# Patient Record
Sex: Female | Born: 1995 | Race: Black or African American | Hispanic: No | Marital: Single | Smoking: Current every day smoker
Health system: Southern US, Community
[De-identification: ages and names within clinical notes are randomized; demographics above are authoritative.]

## PROBLEM LIST (undated history)

## (undated) HISTORY — PX: CHOLECYSTECTOMY: SHX55

---

## 2017-12-30 ENCOUNTER — Emergency Department
Admission: EM | Admit: 2017-12-30 | Discharge: 2017-12-30 | Disposition: A | Discharge status: Home / Self Care | Payer: Self-pay | Attending: Emergency Medicine | Admitting: Emergency Medicine

## 2017-12-30 ENCOUNTER — Emergency Department: Payer: Self-pay

## 2017-12-30 ENCOUNTER — Encounter: Payer: Self-pay | Admitting: Emergency Medicine

## 2017-12-30 DIAGNOSIS — K2900 Acute gastritis without bleeding: Secondary | ICD-10-CM | POA: Insufficient documentation

## 2017-12-30 DIAGNOSIS — F17200 Nicotine dependence, unspecified, uncomplicated: Secondary | ICD-10-CM | POA: Insufficient documentation

## 2017-12-30 LAB — CBC
HEMATOCRIT: 42 % (ref 35.0–47.0)
HEMOGLOBIN: 14 g/dL (ref 12.0–16.0)
MCH: 28.7 pg (ref 26.0–34.0)
MCHC: 33.3 g/dL (ref 32.0–36.0)
MCV: 86.2 fL (ref 80.0–100.0)
Platelets: 145 10*3/uL — ABNORMAL LOW (ref 150–440)
RBC: 4.88 MIL/uL (ref 3.80–5.20)
RDW: 14.2 % (ref 11.5–14.5)
WBC: 17 10*3/uL — AB (ref 3.6–11.0)

## 2017-12-30 LAB — BASIC METABOLIC PANEL
ANION GAP: 8 (ref 5–15)
BUN: 7 mg/dL (ref 6–20)
CO2: 23 mmol/L (ref 22–32)
Calcium: 9 mg/dL (ref 8.9–10.3)
Chloride: 106 mmol/L (ref 101–111)
Creatinine, Ser: 0.63 mg/dL (ref 0.44–1.00)
GFR calc non Af Amer: >60 mL/min (ref >60)
Glucose, Bld: 95 mg/dL (ref 65–99)
POTASSIUM: 3.8 mmol/L (ref 3.5–5.1)
SODIUM: 137 mmol/L (ref 135–145)

## 2017-12-30 LAB — HEPATIC FUNCTION PANEL
ALT: 22 U/L (ref 14–54)
AST: 20 U/L (ref 15–41)
Albumin: 3.9 g/dL (ref 3.5–5.0)
Alkaline Phosphatase: 69 U/L (ref 38–126)
TOTAL PROTEIN: 7.4 g/dL (ref 6.5–8.1)
Total Bilirubin: 0.5 mg/dL (ref 0.3–1.2)

## 2017-12-30 LAB — LIPASE, BLOOD: LIPASE: 32 U/L (ref 11–51)

## 2017-12-30 LAB — TROPONIN I: Troponin I: <0.03 ng/mL (ref <0.03)

## 2017-12-30 LAB — POCT PREGNANCY, URINE: PREG TEST UR: NEGATIVE

## 2017-12-30 MED ORDER — GI COCKTAIL ~~LOC~~
30.0000 mL | Freq: Once | ORAL | Status: AC
  Administered 2017-12-30: 30 mL via ORAL
  Filled 2017-12-30: qty 30

## 2017-12-30 MED ORDER — OMEPRAZOLE 40 MG PO CPDR: 40.0000 mg | DELAYED_RELEASE_CAPSULE | Freq: Every day | ORAL | 1 refills | Status: DC

## 2017-12-30 NOTE — ED Triage Notes (Signed)
Patient comes into the ED via POV c/o chest pain and emesis.  Patient denies any radiating pain, shortness of breath, or dizziness.  Patient states she has upper abdominal pain as well.  Patient ambulatory to triage at this time and in NAD with even and unlabored respirations.

## 2017-12-30 NOTE — ED Provider Notes (Signed)
[facility] Emergency Department Provider Note   ____________________________________________    I have reviewed the triage vital signs and the nursing notes.   HISTORY  Chief Complaint Abdominal pain    HPI Gabrielle Delacruz is a 22 y.o. female who presents with complaints of abdominal pain, patient describes burning in her epigastrium which started 1 day ago and has been intermittent.  She has a history of a cholecystectomy.  She does report that she has problems with acid reflux.  No chest pain described.  No pleurisy or shortness of breath.  No fevers or chills.  No diarrhea.  Has not taken anything for this.   History reviewed. No pertinent past medical history.  There are no active problems to display for this patient.   Past Surgical History:  Procedure Laterality Date  . CHOLECYSTECTOMY      Prior to Admission medications   Medication Sig Start Date End Date Taking? Authorizing Provider  omeprazole (PRILOSEC) 40 MG capsule Take 1 capsule (40 mg total) by mouth daily. 12/30/17 12/30/18  Jene EveryKinner, Bisma Klett, MD     Allergies Patient has no known allergies.  No family history on file.  Social History Social History   Tobacco Use  . Smoking status: Current Every Day Smoker  . Smokeless tobacco: Never Used  Substance Use Topics  . Alcohol use: No    Frequency: Never  . Drug use: No    Review of Systems  Constitutional: No fever/chills Eyes: No visual changes.  ENT: No sore throat. Cardiovascular: Denies chest pain. Respiratory: Denies shortness of breath. Gastrointestinal: No nausea, no vomiting.   Genitourinary: Negative for dysuria. Musculoskeletal: Negative for back pain. Skin: Negative for rash. Neurological: Negative for headaches    ____________________________________________   PHYSICAL EXAM:  VITAL SIGNS: ED Triage Vitals  Enc Vitals Group     BP 12/30/17 1950 132/87     Pulse Rate 12/30/17 1950 100     Resp 12/30/17  1950 17     Temp 12/30/17 1950 98.8 F (37.1 C)     Temp Source 12/30/17 1950 Oral     SpO2 12/30/17 1950 100 %     Weight 12/30/17 1951 97.5 kg (215 lb)     Height 12/30/17 1951 1.6 m (5\' 3" )     Head Circumference --      Peak Flow --      Pain Score 12/30/17 1951 8     Pain Loc --      Pain Edu? --      Excl. in GC? --     Constitutional: Alert and oriented. No acute distress. Pleasant and interactive Eyes: Conjunctivae are normal.   Nose: No congestion/rhinnorhea. Mouth/Throat: Mucous membranes are moist.    Cardiovascular: Normal rate, regular rhythm. Grossly normal heart sounds.  Good peripheral circulation. Respiratory: Normal respiratory effort.  No retractions. Lungs CTAB. Gastrointestinal: Soft and nontender. No distention.  No CVA tenderness. Genitourinary: deferred Musculoskeletal: No lower extremity tenderness nor edema.  Warm and well perfused Neurologic:  Normal speech and language. No gross focal neurologic deficits are appreciated.  Skin:  Skin is warm, dry and intact. No rash noted. Psychiatric: Mood and affect are normal. Speech and behavior are normal.  ____________________________________________   LABS (all labs ordered are listed, but only abnormal results are displayed)  Labs Reviewed  CBC - Abnormal; Notable for the following components:      Result Value   WBC 17.0 (*)    Platelets 145 (*)  All other components within normal limits  HEPATIC FUNCTION PANEL - Abnormal; Notable for the following components:   Bilirubin, Direct <0.1 (*)    All other components within normal limits  BASIC METABOLIC PANEL  TROPONIN I  LIPASE, BLOOD  POCT PREGNANCY, URINE  POC URINE PREG, ED   ____________________________________________  EKG  ED ECG REPORT I, Jene Every, the attending physician, personally viewed and interpreted this ECG.  Date: 12/30/2017  Rhythm: normal sinus rhythm QRS Axis: normal Intervals: normal ST/T Wave abnormalities:  normal Narrative Interpretation: no evidence of acute ischemia  ____________________________________________  RADIOLOGY  Chest x-ray normal ____________________________________________   PROCEDURES  Procedure(s) performed: No  Procedures   Critical Care performed: No ____________________________________________   INITIAL IMPRESSION / ASSESSMENT AND PLAN / ED COURSE  Pertinent labs & imaging results that were available during my care of the patient were reviewed by me and considered in my medical decision making (see chart for details).  Patient presents with epigastric abdominal pain, intermittent times 1 day, symptoms are relatively mild.  Exam is quite reassuring.  Lab work significant for elevated white blood cell count and mildly decreased platelets.  No evidence of bleeding.  Treated with GI cocktail with significant improvement in discomfort.  I will have the patient follow-up closely with PCP for further evaluation    ____________________________________________   FINAL CLINICAL IMPRESSION(S) / ED DIAGNOSES  Final diagnoses:  Acute gastritis without hemorrhage, unspecified gastritis type        Note:  This document was prepared using Dragon voice recognition software and may include unintentional dictation errors.    Jene Every, MD 12/30/17 [phone]

## 2018-07-14 ENCOUNTER — Encounter: Payer: Self-pay | Admitting: Emergency Medicine

## 2018-07-14 ENCOUNTER — Other Ambulatory Visit: Payer: Self-pay

## 2018-07-14 ENCOUNTER — Emergency Department
Admission: EM | Admit: 2018-07-14 | Discharge: 2018-07-14 | Disposition: A | Discharge status: Home / Self Care | Payer: Self-pay | Attending: Emergency Medicine | Admitting: Emergency Medicine

## 2018-07-14 DIAGNOSIS — N898 Other specified noninflammatory disorders of vagina: Secondary | ICD-10-CM | POA: Insufficient documentation

## 2018-07-14 DIAGNOSIS — F17210 Nicotine dependence, cigarettes, uncomplicated: Secondary | ICD-10-CM | POA: Insufficient documentation

## 2018-07-14 DIAGNOSIS — R102 Pelvic and perineal pain: Secondary | ICD-10-CM | POA: Insufficient documentation

## 2018-07-14 LAB — URINALYSIS, COMPLETE (UACMP) WITH MICROSCOPIC
BACTERIA UA: NONE SEEN
Bilirubin Urine: NEGATIVE
Glucose, UA: NEGATIVE mg/dL
Hgb urine dipstick: NEGATIVE
Ketones, ur: 5 mg/dL — AB
Nitrite: NEGATIVE
PROTEIN: 30 mg/dL — AB
Specific Gravity, Urine: 1.03 (ref 1.005–1.030)
pH: 5 (ref 5.0–8.0)

## 2018-07-14 LAB — WET PREP, GENITAL
CLUE CELLS WET PREP: NONE SEEN
Sperm: NONE SEEN
TRICH WET PREP: NONE SEEN
YEAST WET PREP: NONE SEEN

## 2018-07-14 LAB — CHLAMYDIA/NGC RT PCR (ARMC ONLY)
CHLAMYDIA TR: NOT DETECTED
N GONORRHOEAE: NOT DETECTED

## 2018-07-14 LAB — POCT PREGNANCY, URINE: PREG TEST UR: NEGATIVE

## 2018-07-14 MED ORDER — LIDOCAINE HCL (PF) 1 % IJ SOLN
INTRAMUSCULAR | Status: AC
  Administered 2018-07-14: 0.9 mL
  Filled 2018-07-14: qty 5

## 2018-07-14 MED ORDER — CEFTRIAXONE SODIUM 250 MG IJ SOLR
250.0000 mg | Freq: Once | INTRAMUSCULAR | Status: AC
  Administered 2018-07-14: 250 mg via INTRAMUSCULAR
  Filled 2018-07-14: qty 250

## 2018-07-14 MED ORDER — AZITHROMYCIN 500 MG PO TABS
1000.0000 mg | ORAL_TABLET | Freq: Once | ORAL | Status: AC
  Administered 2018-07-14: 1000 mg via ORAL
  Filled 2018-07-14: qty 2

## 2018-07-14 NOTE — ED Provider Notes (Signed)
[facility] Emergency Department Provider Note  ____________________________________________   First MD Initiated Contact with Patient 07/14/18 1117     (approximate)  I have reviewed the triage vital signs and the nursing notes.   HISTORY  Chief Complaint Vaginal Itching    HPI Gabrielle Delacruz is a 22 y.o. female presents emergency department complaining of vaginal itching for 6 days.  She states she is sexually active and had unprotected sex about 2 weeks ago.  She states they usually use condoms.  She is been with same partner for 2 years.  She denies any pain with intercourse.  She denies any vaginal odor.  She states the area has been itchy and she used an over-the-counter insert on Wednesday but now it feels more dry and itchy.  She denies fever chills.  She denies pelvic pain.    History reviewed. No pertinent past medical history.  There are no active problems to display for this patient.   Past Surgical History:  Procedure Laterality Date  . CHOLECYSTECTOMY      Prior to Admission medications   Medication Sig Start Date End Date Taking? Authorizing Provider  omeprazole (PRILOSEC) 40 MG capsule Take 1 capsule (40 mg total) by mouth daily. 12/30/17 12/30/18  Jene Every, MD    Allergies Patient has no known allergies.  No family history on file.  Social History Social History   Tobacco Use  . Smoking status: Current Every Day Smoker  . Smokeless tobacco: Never Used  Substance Use Topics  . Alcohol use: No    Frequency: Never  . Drug use: No    Review of Systems  Constitutional: No fever/chills Eyes: No visual changes. ENT: No sore throat. Respiratory: Denies cough Genitourinary: Negative for dysuria.  Positive for vaginal discharge and itching Musculoskeletal: Negative for back pain. Skin: Negative for rash.    ____________________________________________   PHYSICAL EXAM:  VITAL SIGNS: ED Triage Vitals  Enc Vitals  Group     BP 07/14/18 1108 122/80     Pulse Rate 07/14/18 1108 72     Resp 07/14/18 1108 16     Temp 07/14/18 1108 98.3 F (36.8 C)     Temp Source 07/14/18 1108 Oral     SpO2 07/14/18 1108 99 %     Weight 07/14/18 1105 214 lb 15.2 oz (97.5 kg)     Height --      Head Circumference --      Peak Flow --      Pain Score 07/14/18 1105 0     Pain Loc --      Pain Edu? --      Excl. in GC? --     Constitutional: Alert and oriented. Well appearing and in no acute distress. Eyes: Conjunctivae are normal.  Head: Atraumatic. Nose: No congestion/rhinnorhea. Mouth/Throat: Mucous membranes are moist.   Neck:  supple no lymphadenopathy noted Cardiovascular: Normal rate, regular rhythm.  Respiratory: Normal respiratory effort.  No retractions Abd: soft nontender bs normal all 4 quad GU: External vaginal exam shows some mild white milky discharge.  No herpetic lesions are noted.  Speculum exam was performed and swabs were obtained.  Bimanual exam does not produce any pain. Musculoskeletal: FROM all extremities, warm and well perfused Neurologic:  Normal speech and language.  Skin:  Skin is warm, dry and intact. No rash noted. Psychiatric: Mood and affect are normal. Speech and behavior are normal.  ____________________________________________   LABS (all labs ordered are listed, but  only abnormal results are displayed)  Labs Reviewed  WET PREP, GENITAL - Abnormal; Notable for the following components:      Result Value   WBC, Wet Prep HPF POC FEW (*)    All other components within normal limits  URINALYSIS, COMPLETE (UACMP) WITH MICROSCOPIC - Abnormal; Notable for the following components:   Color, Urine AMBER (*)    APPearance CLOUDY (*)    Ketones, ur 5 (*)    Protein, ur 30 (*)    Leukocytes, UA MODERATE (*)    All other components within normal limits  CHLAMYDIA/NGC RT PCR ([facility] ONLY)  URINE CULTURE  POC URINE PREG, ED  POCT PREGNANCY, URINE    ____________________________________________   ____________________________________________  RADIOLOGY    ____________________________________________   PROCEDURES  Procedure(s) performed: No  Procedures    ____________________________________________   INITIAL IMPRESSION / ASSESSMENT AND PLAN / ED COURSE  Pertinent labs & imaging results that were available during my care of the patient were reviewed by me and considered in my medical decision making (see chart for details).   Patient is a 22 year old female presented emergency department complaining of vaginal itching.  She has a history of unprotected sex.  She is not on any birth control.  On physical exam there is a mild discharge noted in the vaginal vault.  Remainder the exam is unremarkable  Urine pregnant is negative.  Urinalysis shows 5 ketones, moderate leuks, no bacteria.  Wet prep shows few white blood cells but no clue or yeast.  The GC chlamydia test is negative.  The patient had already been treated with Rocephin and Zithromax.  She is to follow-up with [facility] if continued vaginal itching.  A urine culture was ordered.  She states she understands will comply with our instructions.  She was discharged in stable condition.     As part of my medical decision making, I reviewed the following data within the electronic MEDICAL RECORD NUMBER Nursing notes reviewed and incorporated, Labs reviewed as noted above, Notes from prior ED visits and [place] Controlled Substance Database  ____________________________________________   FINAL CLINICAL IMPRESSION(S) / ED DIAGNOSES  Final diagnoses:  Vaginal discharge  Vaginal itching      NEW MEDICATIONS STARTED DURING THIS VISIT:  Discharge Medication List as of 07/14/2018  1:20 PM       Note:  This document was prepared using Dragon voice recognition software and may include unintentional dictation errors.    Faythe GheeFisher, Talyia Allende W,  PA-C 07/14/18 1522    Sharman CheekStafford, Phillip, MD 07/21/18 Jerene [id number]

## 2018-07-14 NOTE — Discharge Instructions (Addendum)
Follow-up with either the [facility] or Dr. Gaynelle ArabianShuman for a yearly Pap smear.  Return to the emergency department if you are worsening.  There is no yeast to you do not need a yeast medication.  Your gonorrhea and Chlamydia test are both negative.

## 2018-07-14 NOTE — ED Triage Notes (Signed)
C/O vaginal itching x 6 days.  Tried OTC Monistat without relief.

## 2018-07-15 LAB — URINE CULTURE: SPECIAL REQUESTS: NORMAL

## 2019-01-08 ENCOUNTER — Other Ambulatory Visit: Payer: Self-pay

## 2019-01-08 ENCOUNTER — Encounter: Payer: Self-pay | Admitting: Emergency Medicine

## 2019-01-08 ENCOUNTER — Emergency Department: Payer: Medicaid Other

## 2019-01-08 ENCOUNTER — Emergency Department
Admission: EM | Admit: 2019-01-08 | Discharge: 2019-01-08 | Disposition: A | Discharge status: Home / Self Care | Payer: Medicaid Other | Attending: Emergency Medicine | Admitting: Emergency Medicine

## 2019-01-08 DIAGNOSIS — F17200 Nicotine dependence, unspecified, uncomplicated: Secondary | ICD-10-CM | POA: Diagnosis not present

## 2019-01-08 DIAGNOSIS — R1011 Right upper quadrant pain: Secondary | ICD-10-CM

## 2019-01-08 LAB — COMPREHENSIVE METABOLIC PANEL
ALT: 25 U/L (ref 0–44)
AST: 20 U/L (ref 15–41)
Albumin: 3.7 g/dL (ref 3.5–5.0)
Alkaline Phosphatase: 62 U/L (ref 38–126)
Anion gap: 7 (ref 5–15)
BILIRUBIN TOTAL: 0.7 mg/dL (ref 0.3–1.2)
BUN: 9 mg/dL (ref 6–20)
CO2: 25 mmol/L (ref 22–32)
CREATININE: 0.62 mg/dL (ref 0.44–1.00)
Calcium: 8.6 mg/dL — ABNORMAL LOW (ref 8.9–10.3)
Chloride: 108 mmol/L (ref 98–111)
GFR calc Af Amer: >60 mL/min (ref >60)
GFR calc non Af Amer: >60 mL/min (ref >60)
Glucose, Bld: 82 mg/dL (ref 70–99)
Potassium: 3.6 mmol/L (ref 3.5–5.1)
Sodium: 140 mmol/L (ref 135–145)
Total Protein: 6.7 g/dL (ref 6.5–8.1)

## 2019-01-08 LAB — CBC
HCT: 36.9 % (ref 36.0–46.0)
Hemoglobin: 12.4 g/dL (ref 12.0–15.0)
MCH: 30.1 pg (ref 26.0–34.0)
MCHC: 33.6 g/dL (ref 30.0–36.0)
MCV: 89.6 fL (ref 80.0–100.0)
Platelets: 149 10*3/uL — ABNORMAL LOW (ref 150–400)
RBC: 4.12 MIL/uL (ref 3.87–5.11)
RDW: 12.6 % (ref 11.5–15.5)
WBC: 10.3 10*3/uL (ref 4.0–10.5)
nRBC: 0 % (ref 0.0–0.2)

## 2019-01-08 LAB — LIPASE, BLOOD: Lipase: 30 U/L (ref 11–51)

## 2019-01-08 LAB — POCT PREGNANCY, URINE: Preg Test, Ur: NEGATIVE

## 2019-01-08 MED ORDER — IBUPROFEN 800 MG PO TABS: 800.0000 mg | ORAL_TABLET | Freq: Three times a day (TID) | ORAL | 0 refills | Status: DC | PRN

## 2019-01-08 MED ORDER — OXYCODONE-ACETAMINOPHEN 5-325 MG PO TABS
2.0000 | ORAL_TABLET | Freq: Once | ORAL | Status: AC
  Administered 2019-01-08: 2 via ORAL
  Filled 2019-01-08: qty 2

## 2019-01-08 MED ORDER — DIAZEPAM 5 MG PO TABS: 5.0000 mg | ORAL_TABLET | Freq: Three times a day (TID) | ORAL | 0 refills | Status: DC | PRN

## 2019-01-08 MED ORDER — SODIUM CHLORIDE 0.9% FLUSH: 3.0000 mL | Freq: Once | INTRAVENOUS | Status: DC

## 2019-01-08 NOTE — ED Provider Notes (Signed)
[facility] Emergency Department Provider Note       Time seen: ----------------------------------------- 8:27 PM on 01/08/2019 -----------------------------------------   I have reviewed the triage vital signs and the nursing notes.  HISTORY   Chief Complaint Abdominal Pain    HPI Gabrielle Delacruz is a 23 y.o. female with a history of cholecystectomy who presents to the ED for right upper quadrant pain.  Patient reports she had her gallbladder out in 2017.  She reports her pain started yesterday and will go away, she has had pain like this before but it went away in 24 hours.  She had one episode of vomiting yesterday but none today.  She denies any fevers.  History reviewed. No pertinent past medical history.  There are no active problems to display for this patient.   Past Surgical History:  Procedure Laterality Date  . CHOLECYSTECTOMY      Allergies Patient has no known allergies.  Social History Social History   Tobacco Use  . Smoking status: Current Every Day Smoker  . Smokeless tobacco: Never Used  Substance Use Topics  . Alcohol use: No    Frequency: Never  . Drug use: No   Review of Systems Constitutional: Negative for fever. Cardiovascular: Negative for chest pain. Respiratory: Negative for shortness of breath. Gastrointestinal: Positive for abdominal pain, recent vomiting Musculoskeletal: Negative for back pain. Skin: Negative for rash. Neurological: Negative for headaches, focal weakness or numbness.  All systems negative/normal/unremarkable except as stated in the HPI  ____________________________________________   PHYSICAL EXAM:  VITAL SIGNS: ED Triage Vitals  Enc Vitals Group     BP 01/08/19 1929 131/73     Pulse Rate 01/08/19 1929 (!) 101     Resp 01/08/19 1929 17     Temp 01/08/19 1929 98.6 F (37 C)     Temp Source 01/08/19 1929 Oral     SpO2 01/08/19 1929 99 %     Weight 01/08/19 1930 203 lb (92.1 kg)      Height 01/08/19 1930 5\' 3"  (1.6 m)     Head Circumference --      Peak Flow --      Pain Score 01/08/19 1929 7     Pain Loc --      Pain Edu? --      Excl. in GC? --    Constitutional: Alert and oriented. Well appearing and in no distress. Eyes: Conjunctivae are normal. Normal extraocular movements. ENT      Head: Normocephalic and atraumatic.      Nose: No congestion/rhinnorhea.      Mouth/Throat: Mucous membranes are moist.      Neck: No stridor. Cardiovascular: Normal rate, regular rhythm. No murmurs, rubs, or gallops. Respiratory: Normal respiratory effort without tachypnea nor retractions. Breath sounds are clear and equal bilaterally. No wheezes/rales/rhonchi. Gastrointestinal: Right upper quadrant tenderness, no rebound or guarding.  Normal bowel sounds. Musculoskeletal: Nontender with normal range of motion in extremities. No lower extremity tenderness nor edema. Neurologic:  Normal speech and language. No gross focal neurologic deficits are appreciated.  Skin:  Skin is warm, dry and intact. No rash noted. Psychiatric: Mood and affect are normal. Speech and behavior are normal.  ____________________________________________  ED COURSE:  As part of my medical decision making, I reviewed the following data within the electronic MEDICAL RECORD NUMBER History obtained from family if available, nursing notes, old chart and ekg, as well as notes from prior ED visits. Patient presented for abdominal pain, we will assess  with labs and imaging as indicated at this time.   Procedures ____________________________________________   LABS (pertinent positives/negatives)  Labs Reviewed  COMPREHENSIVE METABOLIC PANEL - Abnormal; Notable for the following components:      Result Value   Calcium 8.6 (*)    All other components within normal limits  CBC - Abnormal; Notable for the following components:   Platelets 149 (*)    All other components within normal limits  LIPASE, BLOOD   URINALYSIS, COMPLETE (UACMP) WITH MICROSCOPIC  POC URINE PREG, ED  POCT PREGNANCY, URINE    RADIOLOGY Images were viewed by me  Right upper quadrant ultrasound  IMPRESSION: Status post cholecystectomy.  Otherwise negative right upper quadrant ultrasound. ____________________________________________   DIFFERENTIAL DIAGNOSIS   Choledocholithiasis, adhesions, GERD, peptic ulcer disease, UTI, renal colic  FINAL ASSESSMENT AND PLAN  Right upper quadrant pain   Plan: The patient had presented for right upper quadrant pain. Patient's labs did not reveal any acute process. Patient's imaging was reassuring.  No clear etiology for her right upper quadrant pain other than perhaps scar tissue from previous surgery.  She is cleared for outpatient follow-up.   Ulice Dash, MD    Note: This note was generated in part or whole with voice recognition software. Voice recognition is usually quite accurate but there are transcription errors that can and very often do occur. I apologize for any typographical errors that were not detected and corrected.     Emily Filbert, MD 01/08/19 2152

## 2019-01-08 NOTE — ED Triage Notes (Signed)
Patient is having right upper abd pain but had gallbladder out in 2017. Patient stated her pain started yesterday and wont go away.states she has had pain like this before but it went away in 24 hours. Patient states she had one episode of emesis yesterday, none today. Patient denies any fevers but says the pain is just "really bad and won't go way".

## 2019-01-09 LAB — URINALYSIS, COMPLETE (UACMP) WITH MICROSCOPIC
Bacteria, UA: NONE SEEN
Bilirubin Urine: NEGATIVE
Glucose, UA: NEGATIVE mg/dL
Ketones, ur: NEGATIVE mg/dL
Leukocytes,Ua: NEGATIVE
Nitrite: NEGATIVE
Protein, ur: 30 mg/dL — AB
RBC / HPF: >50 RBC/hpf — ABNORMAL HIGH (ref 0–5)
Specific Gravity, Urine: 1.029 (ref 1.005–1.030)
pH: 7 (ref 5.0–8.0)

## 2019-05-01 ENCOUNTER — Emergency Department
Admission: EM | Admit: 2019-05-01 | Discharge: 2019-05-01 | Disposition: A | Discharge status: Home / Self Care | Payer: Medicaid Other | Attending: Emergency Medicine | Admitting: Emergency Medicine

## 2019-05-01 ENCOUNTER — Other Ambulatory Visit: Payer: Self-pay

## 2019-05-01 ENCOUNTER — Emergency Department: Payer: Medicaid Other

## 2019-05-01 ENCOUNTER — Encounter: Payer: Self-pay | Admitting: Emergency Medicine

## 2019-05-01 DIAGNOSIS — R0781 Pleurodynia: Secondary | ICD-10-CM | POA: Diagnosis not present

## 2019-05-01 DIAGNOSIS — R079 Chest pain, unspecified: Secondary | ICD-10-CM | POA: Diagnosis present

## 2019-05-01 DIAGNOSIS — F17210 Nicotine dependence, cigarettes, uncomplicated: Secondary | ICD-10-CM | POA: Diagnosis not present

## 2019-05-01 DIAGNOSIS — R0789 Other chest pain: Secondary | ICD-10-CM | POA: Diagnosis not present

## 2019-05-01 MED ORDER — CYCLOBENZAPRINE HCL 10 MG PO TABS: 10.0000 mg | ORAL_TABLET | Freq: Three times a day (TID) | ORAL | 0 refills | Status: DC | PRN

## 2019-05-01 MED ORDER — NAPROXEN 500 MG PO TABS: 500.0000 mg | ORAL_TABLET | Freq: Two times a day (BID) | ORAL | 0 refills | Status: DC

## 2019-05-01 NOTE — ED Triage Notes (Signed)
Says right upper quad pain--under ribs started 2 days ago.  Thinks she possibly pulled something as it started after she was playing with boyfriend.

## 2019-05-01 NOTE — ED Provider Notes (Signed)
[facility] Emergency Department Provider Note ____________________________________________  Time seen: Approximately 9:36 AM  I have reviewed the triage vital signs and the nursing notes.   HISTORY  Chief Complaint Chest Pain    HPI Gabrielle Delacruz is a 23 y.o. female who presents to the emergency department for evaluation and treatment of right lower rib pain after wrestling with fiance a few days ago. Pain worsened after lifting an air conditioning unit yesterday. Some relief with ibuprofen.  History reviewed. No pertinent past medical history.  There are no active problems to display for this patient.   Past Surgical History:  Procedure Laterality Date  . CHOLECYSTECTOMY      Prior to Admission medications   Medication Sig Start Date End Date Taking? Authorizing Provider  cyclobenzaprine (FLEXERIL) 10 MG tablet Take 1 tablet (10 mg total) by mouth 3 (three) times daily as needed. 05/01/19   Valerya Maxton, Johnette Abraham B, FNP  naproxen (NAPROSYN) 500 MG tablet Take 1 tablet (500 mg total) by mouth 2 (two) times daily with a meal. 05/01/19   Simra Fiebig B, FNP    Allergies Patient has no known allergies.  No family history on file.  Social History Social History   Tobacco Use  . Smoking status: Current Every Day Smoker    Packs/day: 0.50    Types: Cigarettes  . Smokeless tobacco: Never Used  Substance Use Topics  . Alcohol use: No    Frequency: Never  . Drug use: No    Review of Systems Constitutional: Negative for fever. Cardiovascular: Negative for chest pain. Respiratory: Negative for shortness of breath. Musculoskeletal: Positive for right rib pain Skin: Negative for open wounds or lesions  Neurological: Negative for decrease in sensation  ____________________________________________   PHYSICAL EXAM:  VITAL SIGNS: ED Triage Vitals  Enc Vitals Group     BP 05/01/19 0827 131/84     Pulse Rate 05/01/19 0827 93     Resp 05/01/19 0827 16     Temp 05/01/19 0827 98.7 F (37.1 C)     Temp Source 05/01/19 0827 Oral     SpO2 05/01/19 0827 99 %     Weight 05/01/19 0828 200 lb (90.7 kg)     Height 05/01/19 0828 5\' 3"  (1.6 m)     Head Circumference --      Peak Flow --      Pain Score 05/01/19 0828 7     Pain Loc --      Pain Edu? --      Excl. in [place]? --     Constitutional: Alert and oriented. Well appearing and in no acute distress. Eyes: Conjunctivae are clear without discharge or drainage Head: Atraumatic Neck: Supple. No midline tenderness. Respiratory: No cough. Respirations are even and unlabored. Musculoskeletal: Reproducible pain in the right anterior lower rib with palpation and movement. Neurologic: Awake, alert, oriented x 4.   Skin: No wounds or contusions over area of pain.  Psychiatric: Affect and behavior are appropriate.  ____________________________________________   LABS (all labs ordered are listed, but only abnormal results are displayed)  Labs Reviewed - No data to display ____________________________________________  RADIOLOGY  Chest x-ray is negative for acute findings. ____________________________________________   PROCEDURES  Procedures  ____________________________________________   INITIAL IMPRESSION / ASSESSMENT AND PLAN / ED COURSE  Gabrielle Delacruz is a 23 y.o. who presents to the emergency department for evaluation of right lower rib pain. She suspects that she pulled something when wrestling with her fiance. Chest x-ray and  exam are reassuring. She will be treated with flexeril and naprosyn. She is to follow up with PCP or return to the ER for symptoms that change or worsen.  Medications - No data to display  Pertinent labs & imaging results that were available during my care of the patient were reviewed by me and considered in my medical decision making (see chart for details).  _________________________________________   FINAL CLINICAL IMPRESSION(S) / ED DIAGNOSES  Final  diagnoses:  Rib pain on right side    ED Discharge Orders         Ordered    naproxen (NAPROSYN) 500 MG tablet  2 times daily with meals     05/01/19 0950    cyclobenzaprine (FLEXERIL) 10 MG tablet  3 times daily PRN     05/01/19 0950           If controlled substance prescribed during this visit, 12 month history viewed on the [facility] prior to issuing an initial prescription for Schedule II or III opiod.   Chinita Pesterriplett, [name] B, FNP 05/01/19 [id number]    Jeanmarie PlantMcShane, James A, MD 05/01/19 [phone]

## 2019-05-01 NOTE — ED Triage Notes (Signed)
Pt reports right lower rib pain x 2 days, reports play wrestling with fiance and unsure if she hurt it then.  Reports difficulty putting in a window AC unit due to pain when lifting.  Denies any other complaints.  Ambulatory to triage.  NAD noted at this time.

## 2019-05-01 NOTE — ED Notes (Signed)
See triage note  Presents with pain under right rib area for the past 2-3 days  Denies any injury   States min relief with OTC IBU  Pain increased with movement

## 2019-05-01 NOTE — Discharge Instructions (Signed)
Return to the ER for pain that changes or worsens if unable to schedule an appointment with primary care.  Apply ice to the area 20 minutes per hour while awake for the next couple of days.

## 2019-07-23 DIAGNOSIS — H1045 Other chronic allergic conjunctivitis: Secondary | ICD-10-CM | POA: Diagnosis not present

## 2019-08-29 IMAGING — CR DG CHEST 2V
1 series · 2 of 2 positions shown · non-contrast
Comparison: None.

CLINICAL DATA: Chest pain and emesis

EXAM:
CHEST  2 VIEW

[Series 1: dg chest 2 view · 0.14mm/px · 2 of 2 slices shown]
[im 1/2]
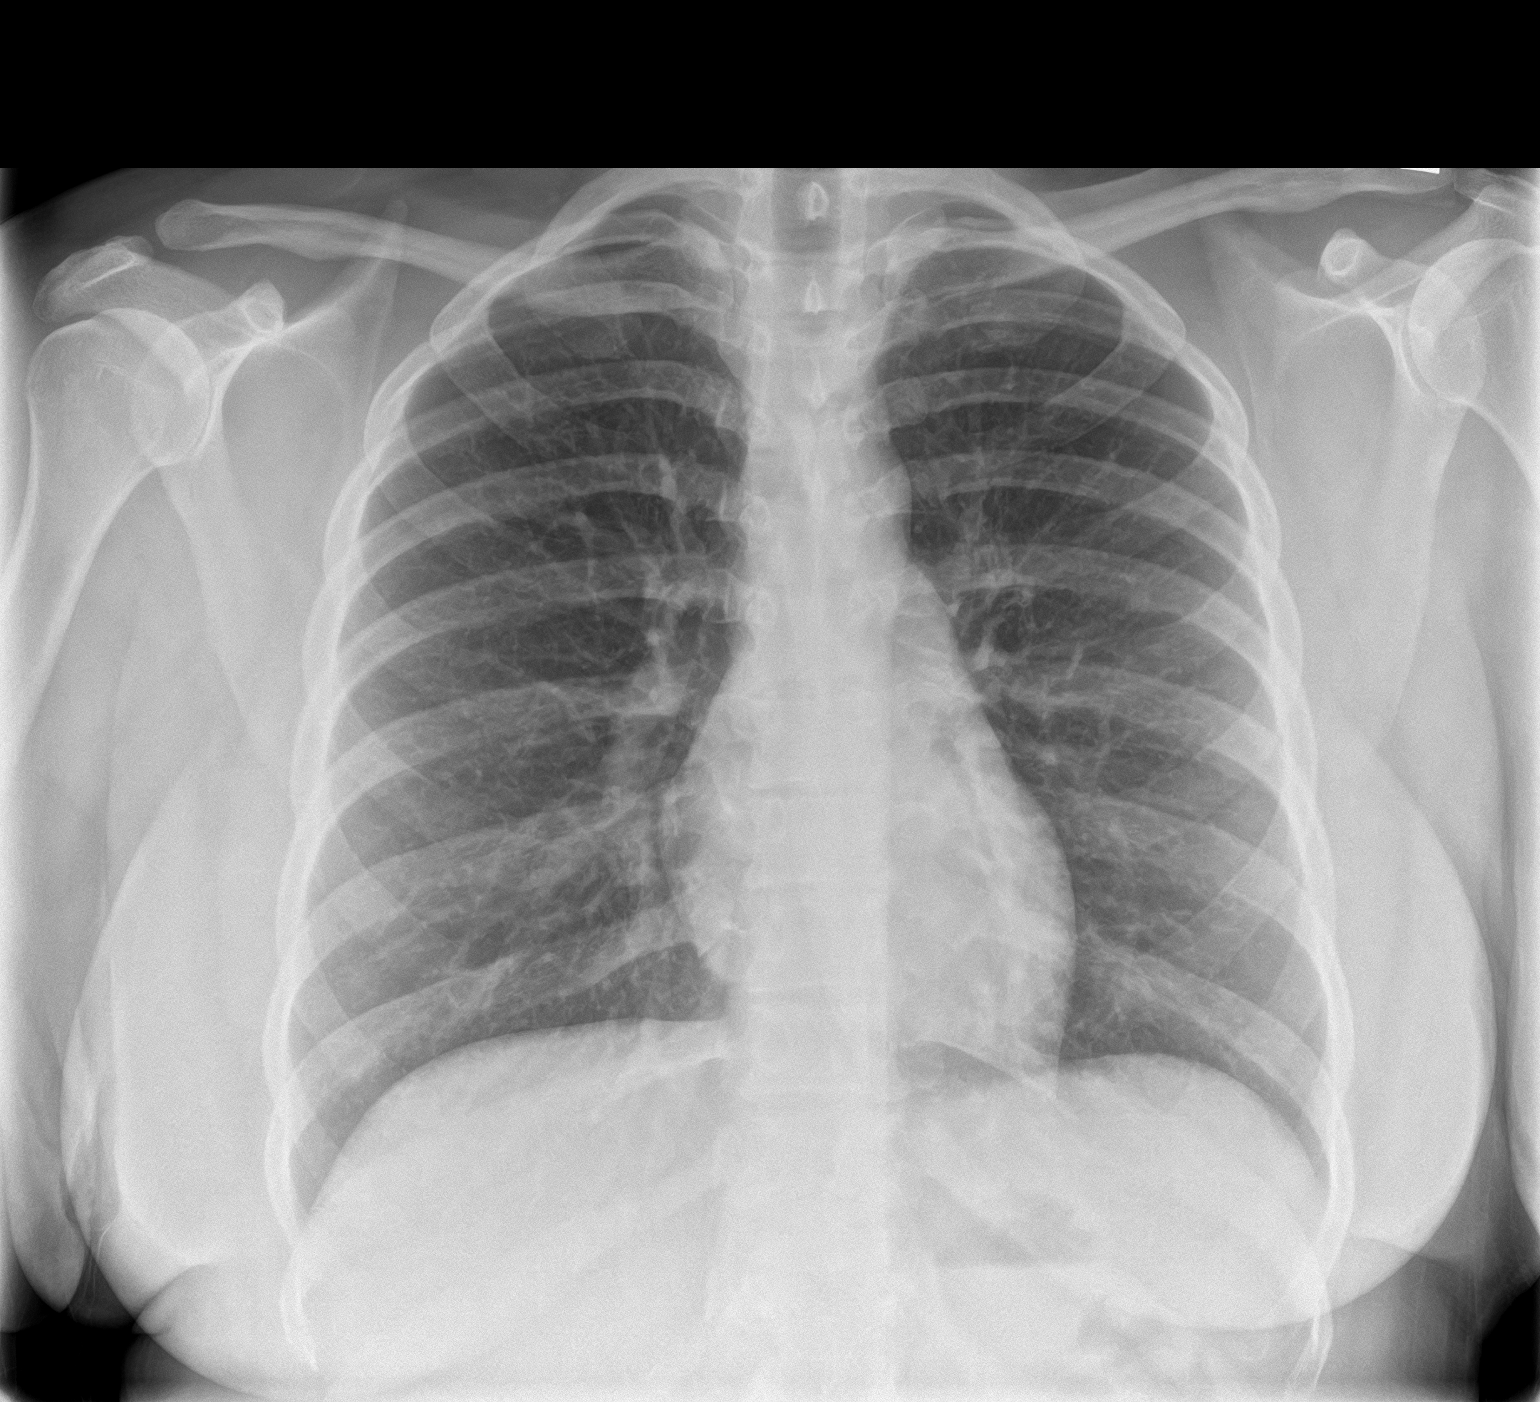
[im 2/2]
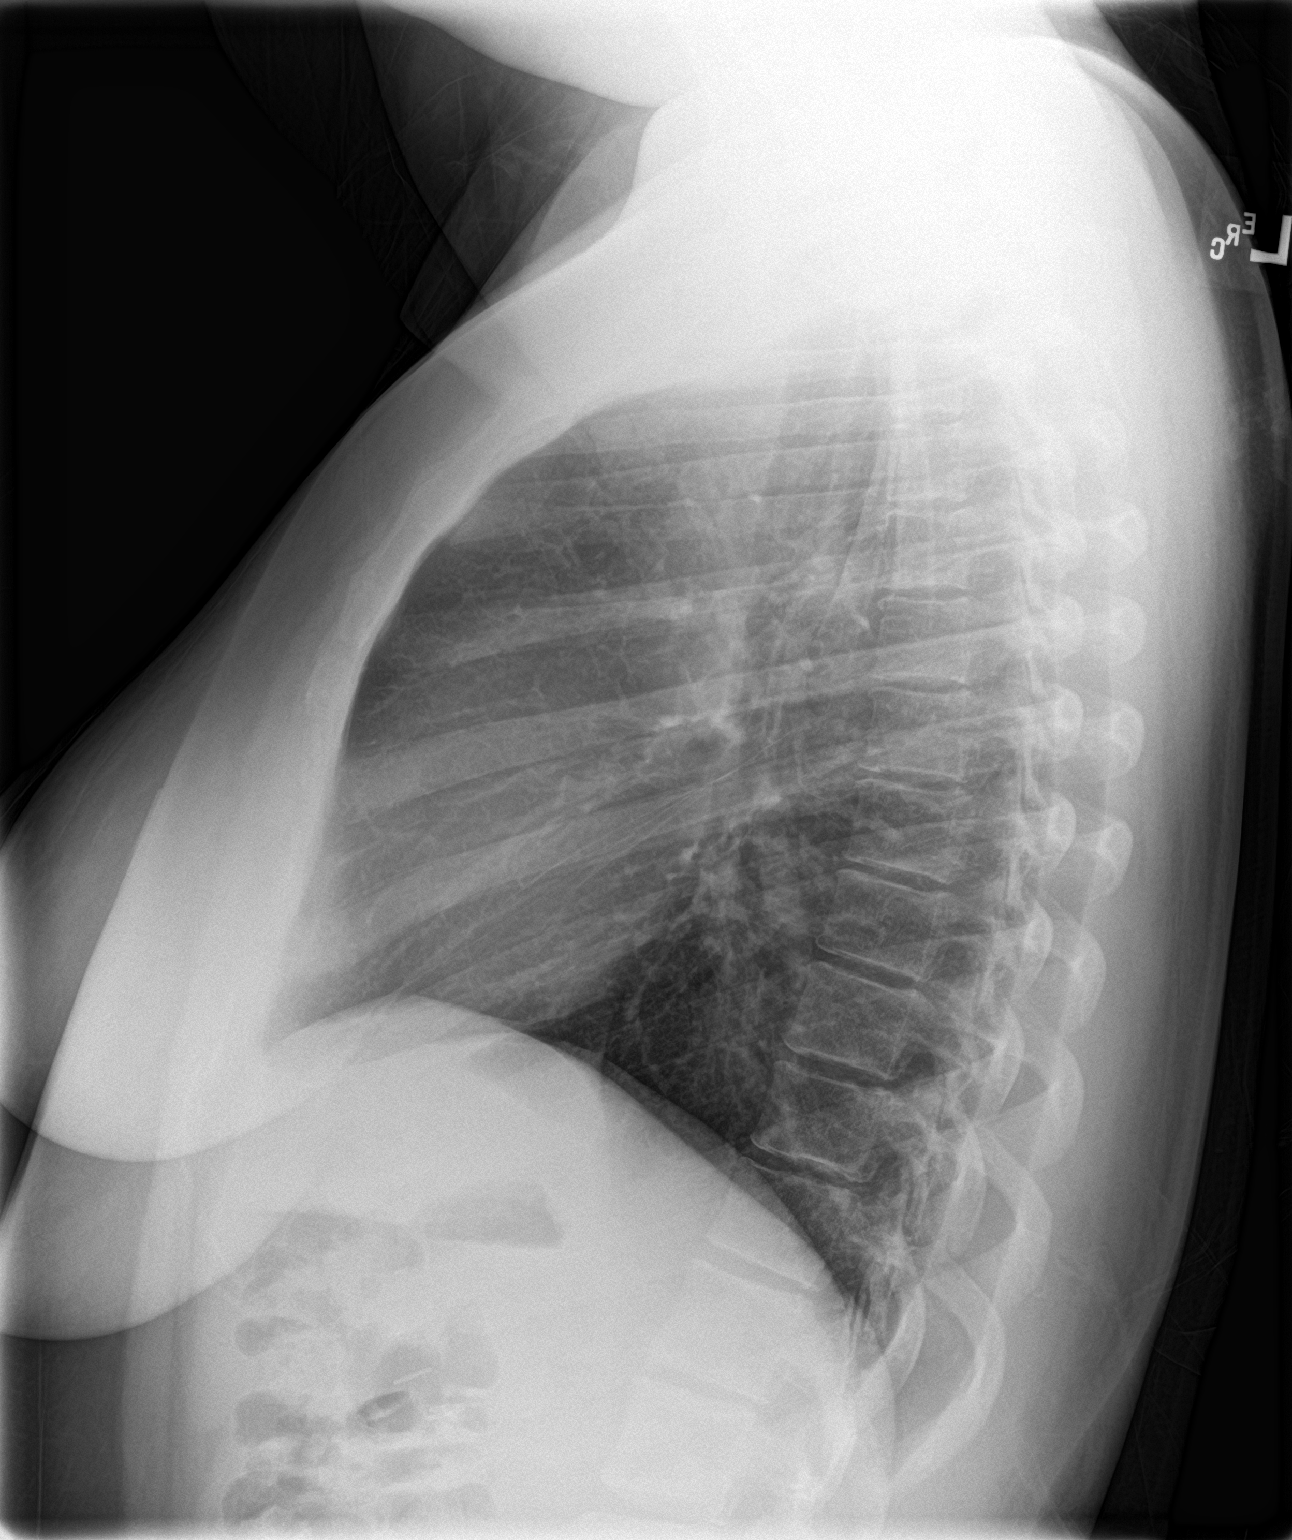

[2 of 2 positions shown; findings below may reference images not displayed]

FINDINGS: Normal cardiac and mediastinal contours. No consolidative pulmonary
opacities. No pleural effusion or pneumothorax. Thoracic spine
degenerative changes.
IMPRESSION: No acute cardiopulmonary process.

## 2020-03-30 ENCOUNTER — Other Ambulatory Visit: Payer: Medicaid Other

## 2020-04-29 DIAGNOSIS — Z419 Encounter for procedure for purposes other than remedying health state, unspecified: Secondary | ICD-10-CM | POA: Diagnosis not present

## 2020-05-30 DIAGNOSIS — Z419 Encounter for procedure for purposes other than remedying health state, unspecified: Secondary | ICD-10-CM | POA: Diagnosis not present

## 2020-06-29 ENCOUNTER — Emergency Department
Admission: EM | Admit: 2020-06-29 | Discharge: 2020-06-29 | Disposition: A | Discharge status: Home / Self Care | Payer: Medicaid Other | Attending: Emergency Medicine | Admitting: Emergency Medicine

## 2020-06-29 ENCOUNTER — Other Ambulatory Visit: Payer: Self-pay

## 2020-06-29 ENCOUNTER — Encounter: Payer: Self-pay | Admitting: Emergency Medicine

## 2020-06-29 DIAGNOSIS — N3000 Acute cystitis without hematuria: Secondary | ICD-10-CM | POA: Diagnosis not present

## 2020-06-29 DIAGNOSIS — N946 Dysmenorrhea, unspecified: Secondary | ICD-10-CM | POA: Diagnosis present

## 2020-06-29 DIAGNOSIS — N920 Excessive and frequent menstruation with regular cycle: Secondary | ICD-10-CM | POA: Insufficient documentation

## 2020-06-29 DIAGNOSIS — F17210 Nicotine dependence, cigarettes, uncomplicated: Secondary | ICD-10-CM | POA: Diagnosis not present

## 2020-06-29 LAB — URINALYSIS, COMPLETE (UACMP) WITH MICROSCOPIC
Bilirubin Urine: NEGATIVE
Glucose, UA: NEGATIVE mg/dL
Ketones, ur: NEGATIVE mg/dL
Nitrite: NEGATIVE
Protein, ur: 100 mg/dL — AB
RBC / HPF: >50 RBC/hpf — ABNORMAL HIGH (ref 0–5)
Specific Gravity, Urine: 1.038 — ABNORMAL HIGH (ref 1.005–1.030)
WBC, UA: >50 WBC/hpf — ABNORMAL HIGH (ref 0–5)
pH: 7 (ref 5.0–8.0)

## 2020-06-29 LAB — POCT PREGNANCY, URINE: Preg Test, Ur: NEGATIVE

## 2020-06-29 MED ORDER — CEPHALEXIN 500 MG PO CAPS: 500.0000 mg | ORAL_CAPSULE | Freq: Two times a day (BID) | ORAL | 0 refills | Status: AC

## 2020-06-29 NOTE — ED Provider Notes (Signed)
[facility] Emergency Department Provider Note  ____________________________________________  Time seen: Approximately 12:39 PM  I have reviewed the triage vital signs and the nursing notes.   HISTORY  Chief Complaint menstrual cramps    HPI Gabrielle Delacruz is a 24 y.o. female presents to the emergency department for treatment and evaluation of menstrual cramps.  Patient states that she typically has very bad menstrual cramping but this time it seems to be worse.   She states that for the past year she has had increase in the amount of bleeding per menstrual cycle and has had multiple clots.  Cycles are regular.  She is not currently using any type of birth control.  History reviewed. No pertinent past medical history.  There are no problems to display for this patient.   Past Surgical History:  Procedure Laterality Date  . CHOLECYSTECTOMY      Prior to Admission medications   Not on File    Allergies Patient has no known allergies.  No family history on file.  Social History Social History   Tobacco Use  . Smoking status: Current Every Day Smoker    Packs/day: 0.50    Types: Cigarettes  . Smokeless tobacco: Never Used  Vaping Use  . Vaping Use: Never used  Substance Use Topics  . Alcohol use: No  . Drug use: No    Review of Systems Constitutional: Negative for fever. Respiratory: Negative for shortness of breath or cough. Gastrointestinal: Positive for abdominal pain; negative for nausea , negative for vomiting. Genitourinary: Negative for dysuria , negative for vaginal discharge.  Positive for vaginal bleeding Musculoskeletal: Negative for back pain. Skin: Negative for acute skin changes/rash/lesion. ____________________________________________   PHYSICAL EXAM:  VITAL SIGNS: ED Triage Vitals  Enc Vitals Group     BP 06/29/20 0949 (!) 118/56     Pulse Rate 06/29/20 0949 69     Resp 06/29/20 0949 18     Temp 06/29/20 0949 98.4  F (36.9 C)     Temp Source 06/29/20 0949 Oral     SpO2 06/29/20 0949 98 %     Weight 06/29/20 0950 210 lb (95.3 kg)     Height 06/29/20 0950 5\' 3"  (1.6 m)     Head Circumference --      Peak Flow --      Pain Score 06/29/20 0956 6     Pain Loc --      Pain Edu? --      Excl. in GC? --     Constitutional: Alert and oriented. Well appearing and in no acute distress. Eyes: Conjunctivae are normal. Head: Atraumatic. Nose: No congestion/rhinnorhea. Mouth/Throat: Mucous membranes are moist. Respiratory: Normal respiratory effort.  No retractions. Gastrointestinal: Bowel sounds active x 4; Abdomen is soft without rebound or guarding. Genitourinary: Pelvic exam: Not indicated Musculoskeletal: No extremity tenderness nor edema.  Neurologic:  Normal speech and language. No gross focal neurologic deficits are appreciated. Speech is normal. No gait instability. Skin:  Skin is warm, dry and intact. No rash noted on exposed skin. Psychiatric: Mood and affect are normal. Speech and behavior are normal.  ____________________________________________   LABS (all labs ordered are listed, but only abnormal results are displayed)  Labs Reviewed  URINALYSIS, COMPLETE (UACMP) WITH MICROSCOPIC - Abnormal; Notable for the following components:      Result Value   Color, Urine YELLOW (*)    APPearance CLOUDY (*)    Specific Gravity, Urine 1.038 (*)    Hgb urine  dipstick LARGE (*)    Protein, ur 100 (*)    Leukocytes,Ua TRACE (*)    RBC / HPF >50 (*)    WBC, UA >50 (*)    Bacteria, UA RARE (*)    All other components within normal limits  POC URINE PREG, ED  POCT PREGNANCY, URINE   ____________________________________________  RADIOLOGY  Not indicated ____________________________________________  Procedures  ____________________________________________  24 year old female presenting to the emergency department for treatment of menstrual cramping.  She reports that pain has been  pretty bad over the past 24 hours.  No relief with over-the-counter medications.  Plan will be to get a urinalysis and a urine pregnancy test.  Urinalysis is consistent with acute cystitis with greater than 50 white blood cells, rare bacteria, trace leukocytes.  She does have some hemoglobin however this may be related to her current menstrual cycle.  She will be treated with Keflex.  She will be advised to follow-up with a primary care provider for choice for symptoms are not improving over the next few days.  She is to return to the emergency department for symptoms of change or worsen if unable to schedule an appointment.  INITIAL IMPRESSION / ASSESSMENT AND PLAN / ED COURSE  Pertinent labs & imaging results that were available during my care of the patient were reviewed by me and considered in my medical decision making (see chart for details).  ____________________________________________   FINAL CLINICAL IMPRESSION(S) / ED DIAGNOSES  Final diagnoses:  Acute cystitis without hematuria    Note:  This document was prepared using Dragon voice recognition software and may include unintentional dictation errors.   Chinita Pester, FNP 06/29/20 1341    Shaune Pollack, MD 07/06/20 1453

## 2020-06-29 NOTE — ED Triage Notes (Signed)
Pt reports has really bad menstrual cramps. Pt states she always has rea;lly bad cramps since she first started her menstrual cycle at age 24.

## 2020-06-29 NOTE — ED Notes (Signed)
See triage note  Presents with abd cramping  States she has hx of cramping with her menes  But feels like this is worse

## 2020-06-30 ENCOUNTER — Telehealth: Payer: Self-pay | Admitting: *Deleted

## 2020-06-30 DIAGNOSIS — Z419 Encounter for procedure for purposes other than remedying health state, unspecified: Secondary | ICD-10-CM | POA: Diagnosis not present

## 2020-06-30 NOTE — Telephone Encounter (Signed)
Contacted patient to complete Transition of Care Assessment: Transition Care Management Follow-up Telephone Call  Date of discharge and from where: 06/29/20, [facility]  How have you been since you were released from the hospital? "ok"  Any questions or concerns? No  Items Reviewed:  Did the pt receive and understand the discharge instructions provided? Yes   Medications obtained and verified? Yes   Any new allergies since your discharge? No   Dietary orders reviewed? NO   Do you have support at home? Yes family  Functional Questionnaire: (I = Independent and D = Dependent) ADLs: I  Bathing/Dressing- I  Meal Prep- I  Eating- I  Maintaining continence- I  Transferring/Ambulation- I  Managing Meds- I  Follow up appointments reviewed:   PCP Hospital f/u appt confirmed? No Patient would like to be assigned a PCPSpecialist Hospital f/u appt confirmed? No    Are transportation arrangements needed? No   If their condition worsens, is the pt aware to call PCP or go to the Emergency Dept.? YES  Was the patient provided with contact information for the PCP's office or ED? YES Was to pt encouraged to call back with questions or concerns? YES  Burnard Bunting, RN, BSN, CCRN [facility] [phone]

## 2020-06-30 NOTE — Telephone Encounter (Signed)
Attempted to call patient ,no answer unable to leave a message.  Gabrielle Delacruz      PEC [phone]

## 2020-07-15 IMAGING — US ULTRASOUND ABDOMEN LIMITED
1 series · 14 of 25 positions shown · non-contrast
Comparison: None.

CLINICAL DATA: Right upper quadrant abdominal pain

EXAM:
ULTRASOUND ABDOMEN LIMITED RIGHT UPPER QUADRANT

[Series 1: ultrasound abdomen limited · 0.20mm/px · 14 of 33 slices shown]
[im 1/33]
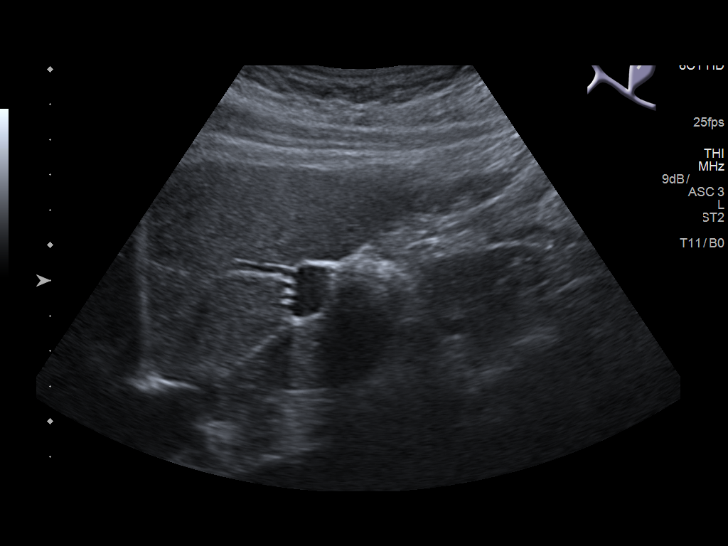
[im 3/33]
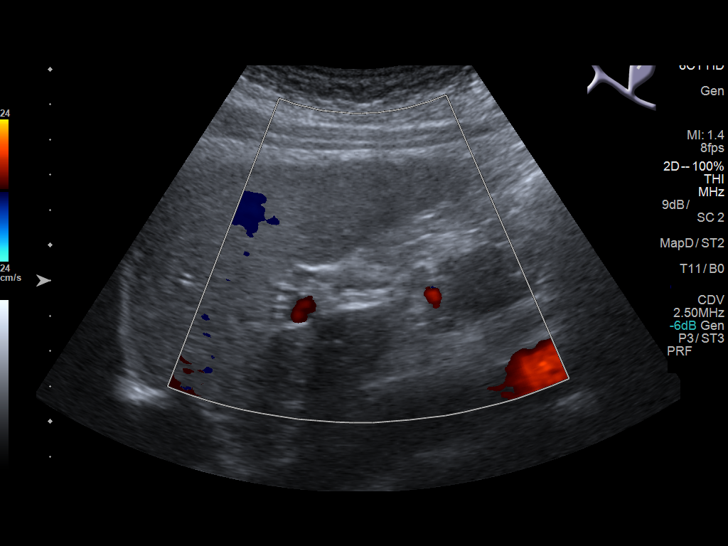
[im 6/33]
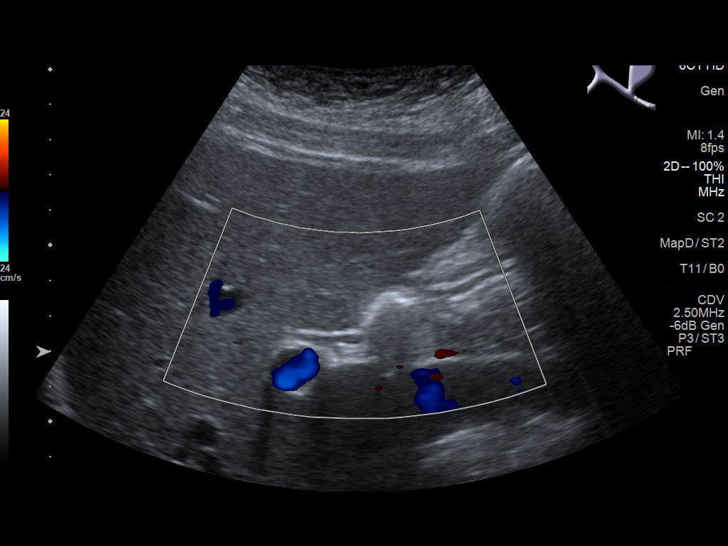
[im 9/33]
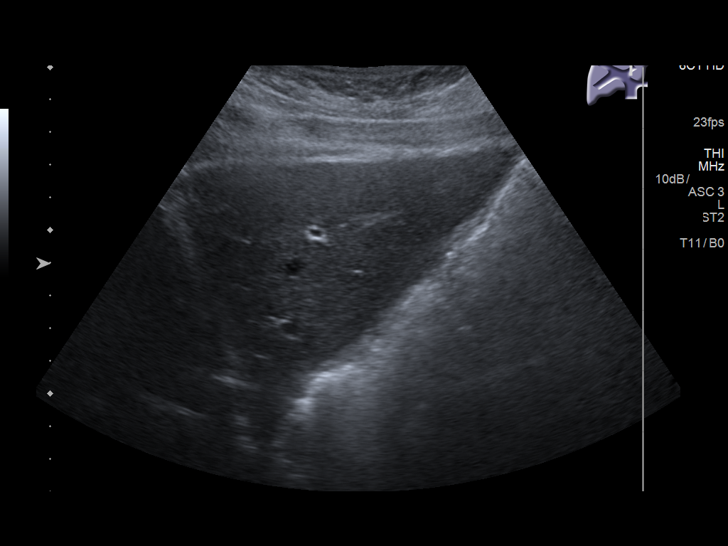
[im 11/33]
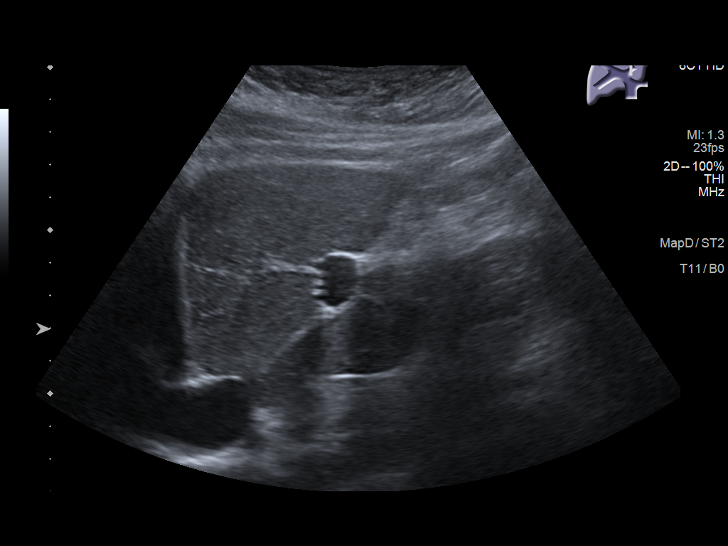
[im 13/33]
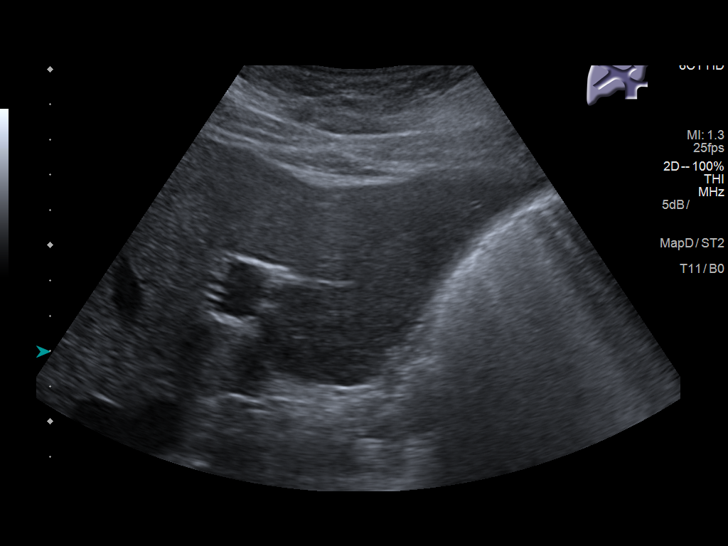
[im 15/33]
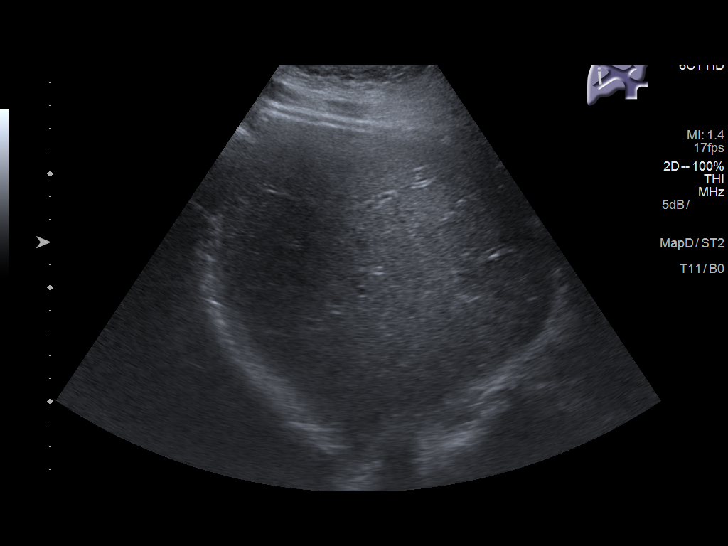
[im 18/33]
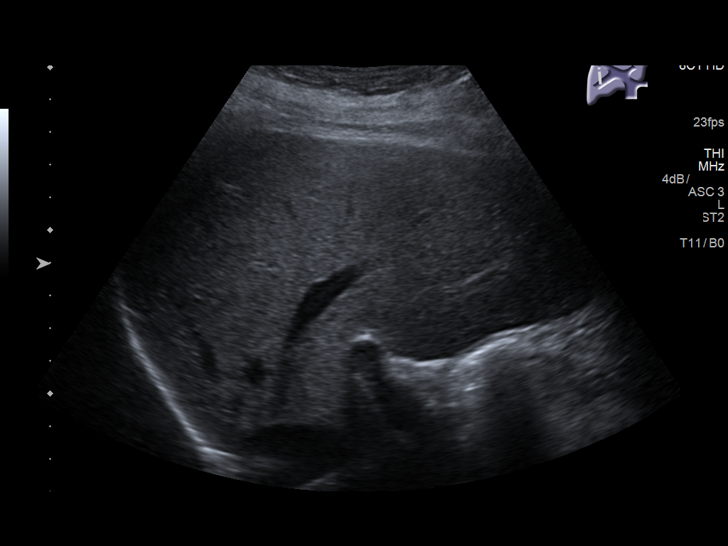
[im 21/33]
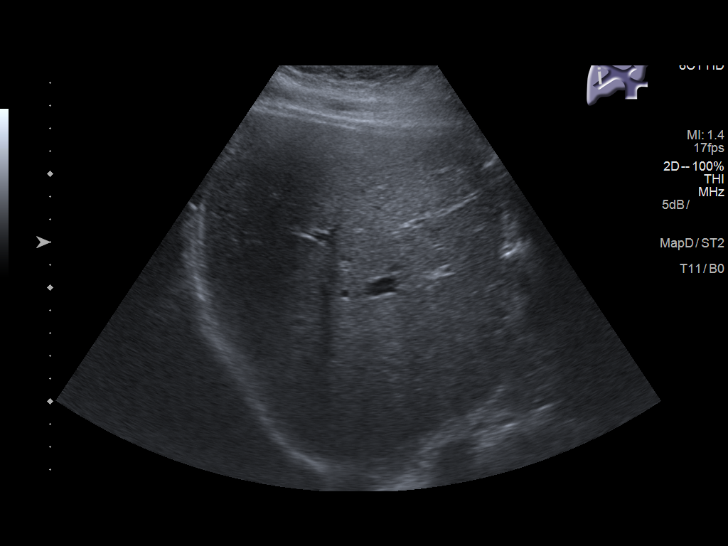
[im 22/33]
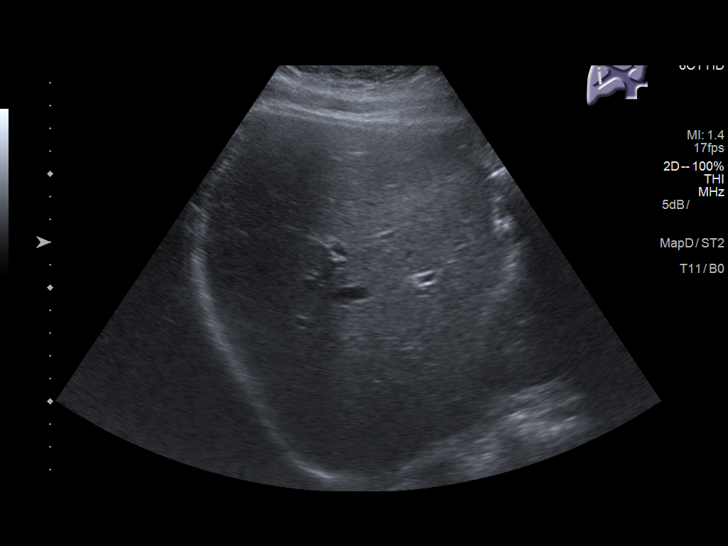
[im 25/33]
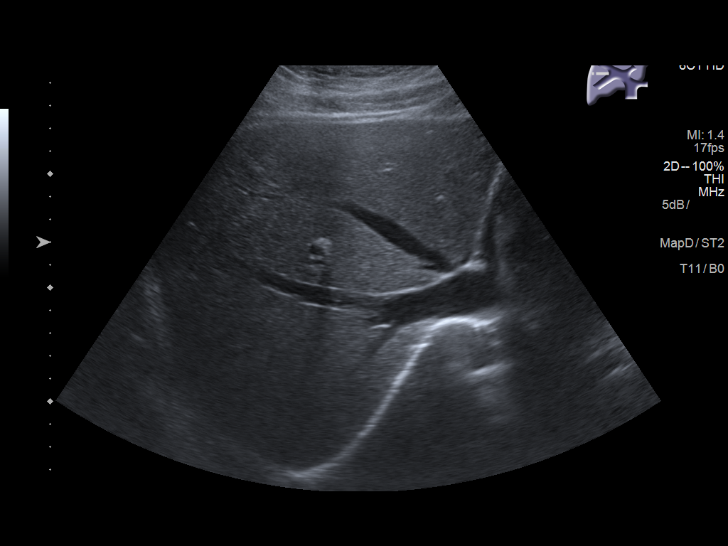
[im 27/33]
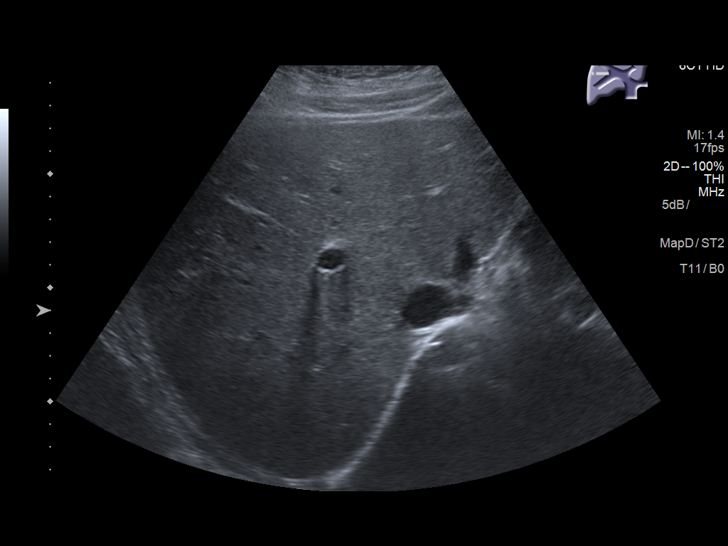
[im 30/33]
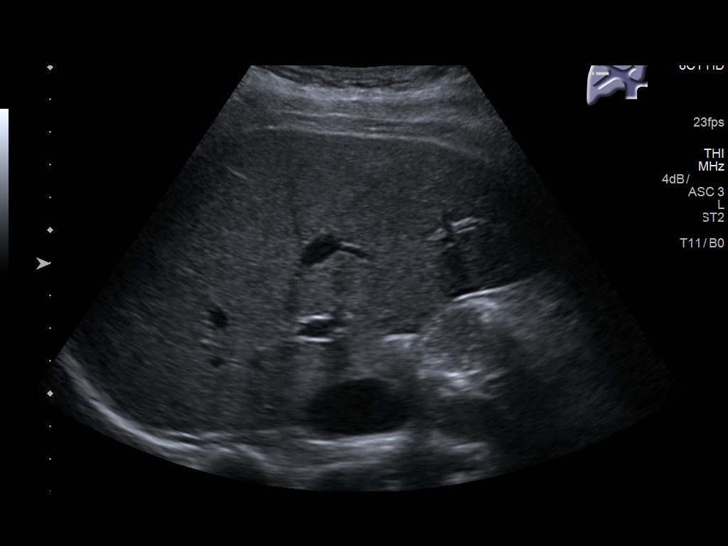
[im 33/33]
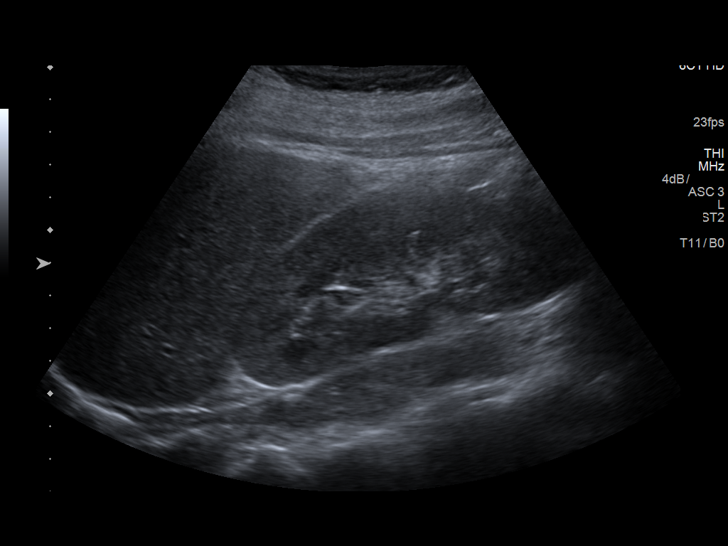

[14 of 25 positions shown; findings below may reference images not displayed]

FINDINGS: Gallbladder:

Surgically absent.

Common bile duct:

Diameter: 2 mm

Liver:

No focal lesion identified. Within normal limits in parenchymal
echogenicity. Portal vein is patent on color Doppler imaging with
normal direction of blood flow towards the liver.
IMPRESSION: Status post cholecystectomy.

Otherwise negative right upper quadrant ultrasound.

## 2020-07-30 DIAGNOSIS — Z419 Encounter for procedure for purposes other than remedying health state, unspecified: Secondary | ICD-10-CM | POA: Diagnosis not present

## 2020-08-12 ENCOUNTER — Ambulatory Visit: Payer: Medicaid Other | Admitting: Physician Assistant

## 2020-08-12 ENCOUNTER — Encounter: Payer: Self-pay | Admitting: Physician Assistant

## 2020-08-12 ENCOUNTER — Other Ambulatory Visit: Payer: Self-pay

## 2020-08-12 DIAGNOSIS — Z113 Encounter for screening for infections with a predominantly sexual mode of transmission: Secondary | ICD-10-CM | POA: Diagnosis not present

## 2020-08-12 LAB — WET PREP FOR TRICH, YEAST, CLUE
Trichomonas Exam: NEGATIVE
Yeast Exam: NEGATIVE

## 2020-08-12 NOTE — Progress Notes (Signed)
  [facility] Department STI clinic/screening visit  Subjective:  Gabrielle Delacruz is a 24 y.o. female being seen today for an STI screening visit. The patient reports they do not have symptoms.  Patient reports that they do not desire a pregnancy in the next year.   They reported they are not interested in discussing contraception today.  Patient's last menstrual period was 07/31/2020.   Patient has the following medical conditions:  There are no problems to display for this patient.   Chief Complaint  Patient presents with  . SEXUALLY TRANSMITTED DISEASE    screening    HPI  Patient reports that she is not having any symptoms but would like a screening today.  States that she does not have any chronic conditions or take any medicines.  Reports that her last HIV test was 06/2020 and she has not had a pap yet but has an appointment with her PCP in about 1 month for her physical.  See flowsheet for further details and programmatic requirements.    The following portions of the patient's history were reviewed and updated as appropriate: allergies, current medications, past medical history, past social history, past surgical history and problem list.  Objective:  There were no vitals filed for this visit.  Physical Exam Constitutional:      General: She is not in acute distress.    Appearance: Normal appearance.  HENT:     Head: Normocephalic and atraumatic.     Comments: No nits,lice, or hair loss. No cervical, supraclavicular or axillary adenopathy.    Mouth/Throat:     Mouth: Mucous membranes are moist.     Pharynx: Oropharynx is clear. No oropharyngeal exudate or posterior oropharyngeal erythema.  Eyes:     Conjunctiva/sclera: Conjunctivae normal.  Pulmonary:     Effort: Pulmonary effort is normal.  Musculoskeletal:     Cervical back: Neck supple. No tenderness.  Skin:    General: Skin is warm and dry.     Findings: No bruising, erythema, lesion or rash.   Neurological:     Mental Status: She is alert and oriented to person, place, and time.  Psychiatric:        Mood and Affect: Mood normal.        Behavior: Behavior normal.        Thought Content: Thought content normal.        Judgment: Judgment normal.      Assessment and Plan:  Gabrielle Delacruz is a 24 y.o. female presenting to the [facility] Department for STI screening  1. Screening for STD (sexually transmitted disease) Patient into clinic without symptoms. Patient opts to self-collect vaginal symptoms for testing.  Counseled patient how to collect samples for accurate results. Reviewed with patient wet mount results and that no indication for any treatment today. Rec condoms with all sex. Await test results.  Counseled that RN will call if needs to RTC for treatment once results are back. - WET PREP FOR TRICH, YEAST, CLUE - Gonococcus culture - Chlamydia/Gonorrhea [facility] - HIV/HCV [facility] - Syphilis Serology, [facility]     No follow-ups on file.  No future appointments.  Matt Holmes, PA

## 2020-08-17 LAB — GONOCOCCUS CULTURE

## 2020-08-18 LAB — HM HIV SCREENING LAB: HM HIV Screening: NEGATIVE

## 2020-08-18 LAB — HM HEPATITIS C SCREENING LAB: HM Hepatitis Screen: NEGATIVE

## 2020-08-30 DIAGNOSIS — Z419 Encounter for procedure for purposes other than remedying health state, unspecified: Secondary | ICD-10-CM | POA: Diagnosis not present

## 2020-09-13 DIAGNOSIS — R300 Dysuria: Secondary | ICD-10-CM | POA: Diagnosis not present

## 2020-09-14 DIAGNOSIS — R300 Dysuria: Secondary | ICD-10-CM | POA: Diagnosis not present

## 2020-09-14 DIAGNOSIS — N898 Other specified noninflammatory disorders of vagina: Secondary | ICD-10-CM | POA: Diagnosis not present

## 2020-09-14 DIAGNOSIS — Z113 Encounter for screening for infections with a predominantly sexual mode of transmission: Secondary | ICD-10-CM | POA: Diagnosis not present

## 2020-09-29 DIAGNOSIS — Z419 Encounter for procedure for purposes other than remedying health state, unspecified: Secondary | ICD-10-CM | POA: Diagnosis not present

## 2020-10-27 ENCOUNTER — Ambulatory Visit: Payer: Medicaid Other | Admitting: Family Medicine

## 2020-10-30 DIAGNOSIS — Z419 Encounter for procedure for purposes other than remedying health state, unspecified: Secondary | ICD-10-CM | POA: Diagnosis not present

## 2020-11-30 DIAGNOSIS — Z419 Encounter for procedure for purposes other than remedying health state, unspecified: Secondary | ICD-10-CM | POA: Diagnosis not present

## 2020-12-28 DIAGNOSIS — Z419 Encounter for procedure for purposes other than remedying health state, unspecified: Secondary | ICD-10-CM | POA: Diagnosis not present

## 2020-12-28 IMAGING — CR CHEST - 2 VIEW
2 series · 2 of 2 positions shown · non-contrast
Comparison: 12/30/2017

CLINICAL DATA: Right-sided rib pain following rough housing

EXAM:
CHEST - 2 VIEW

[chest pa]
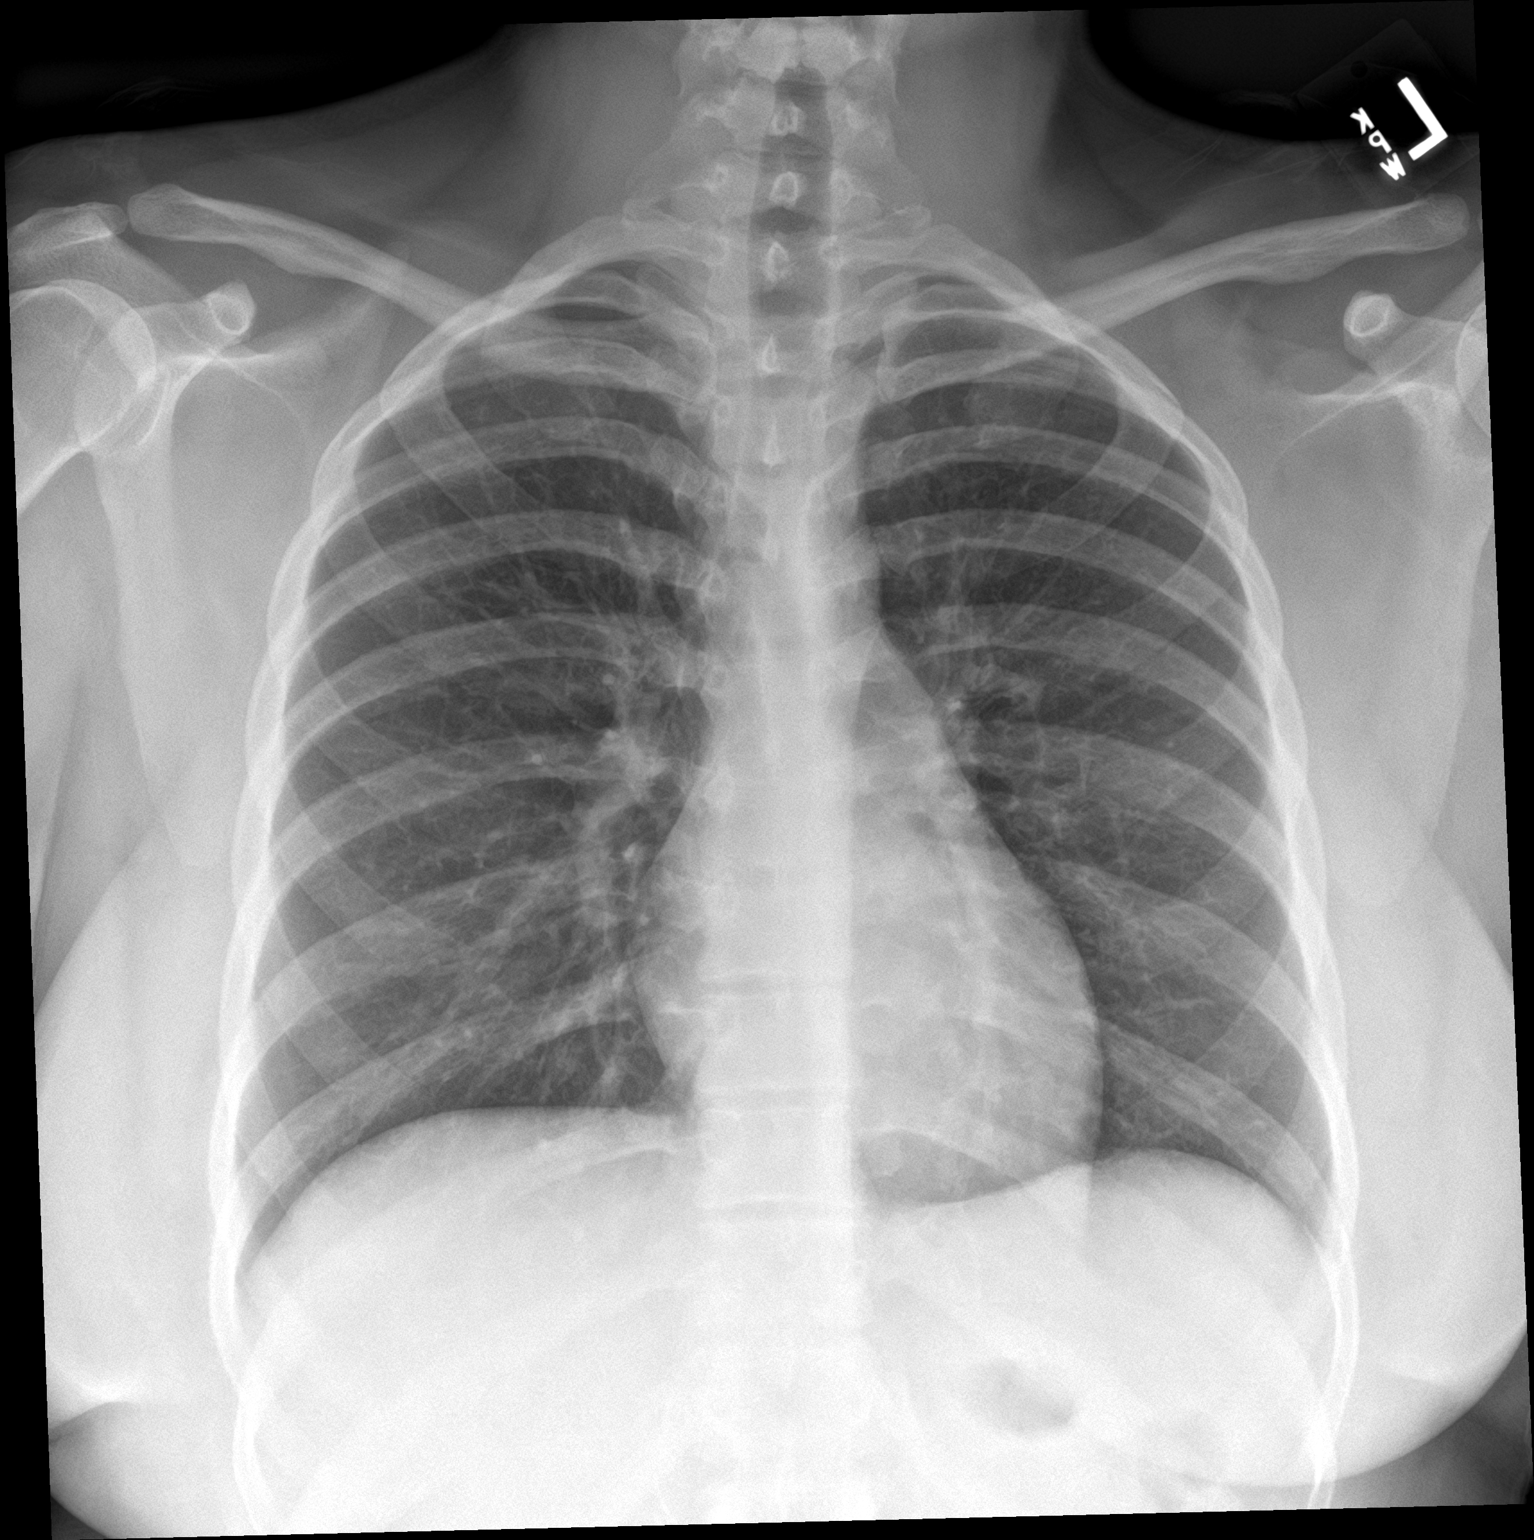

[chest lat]
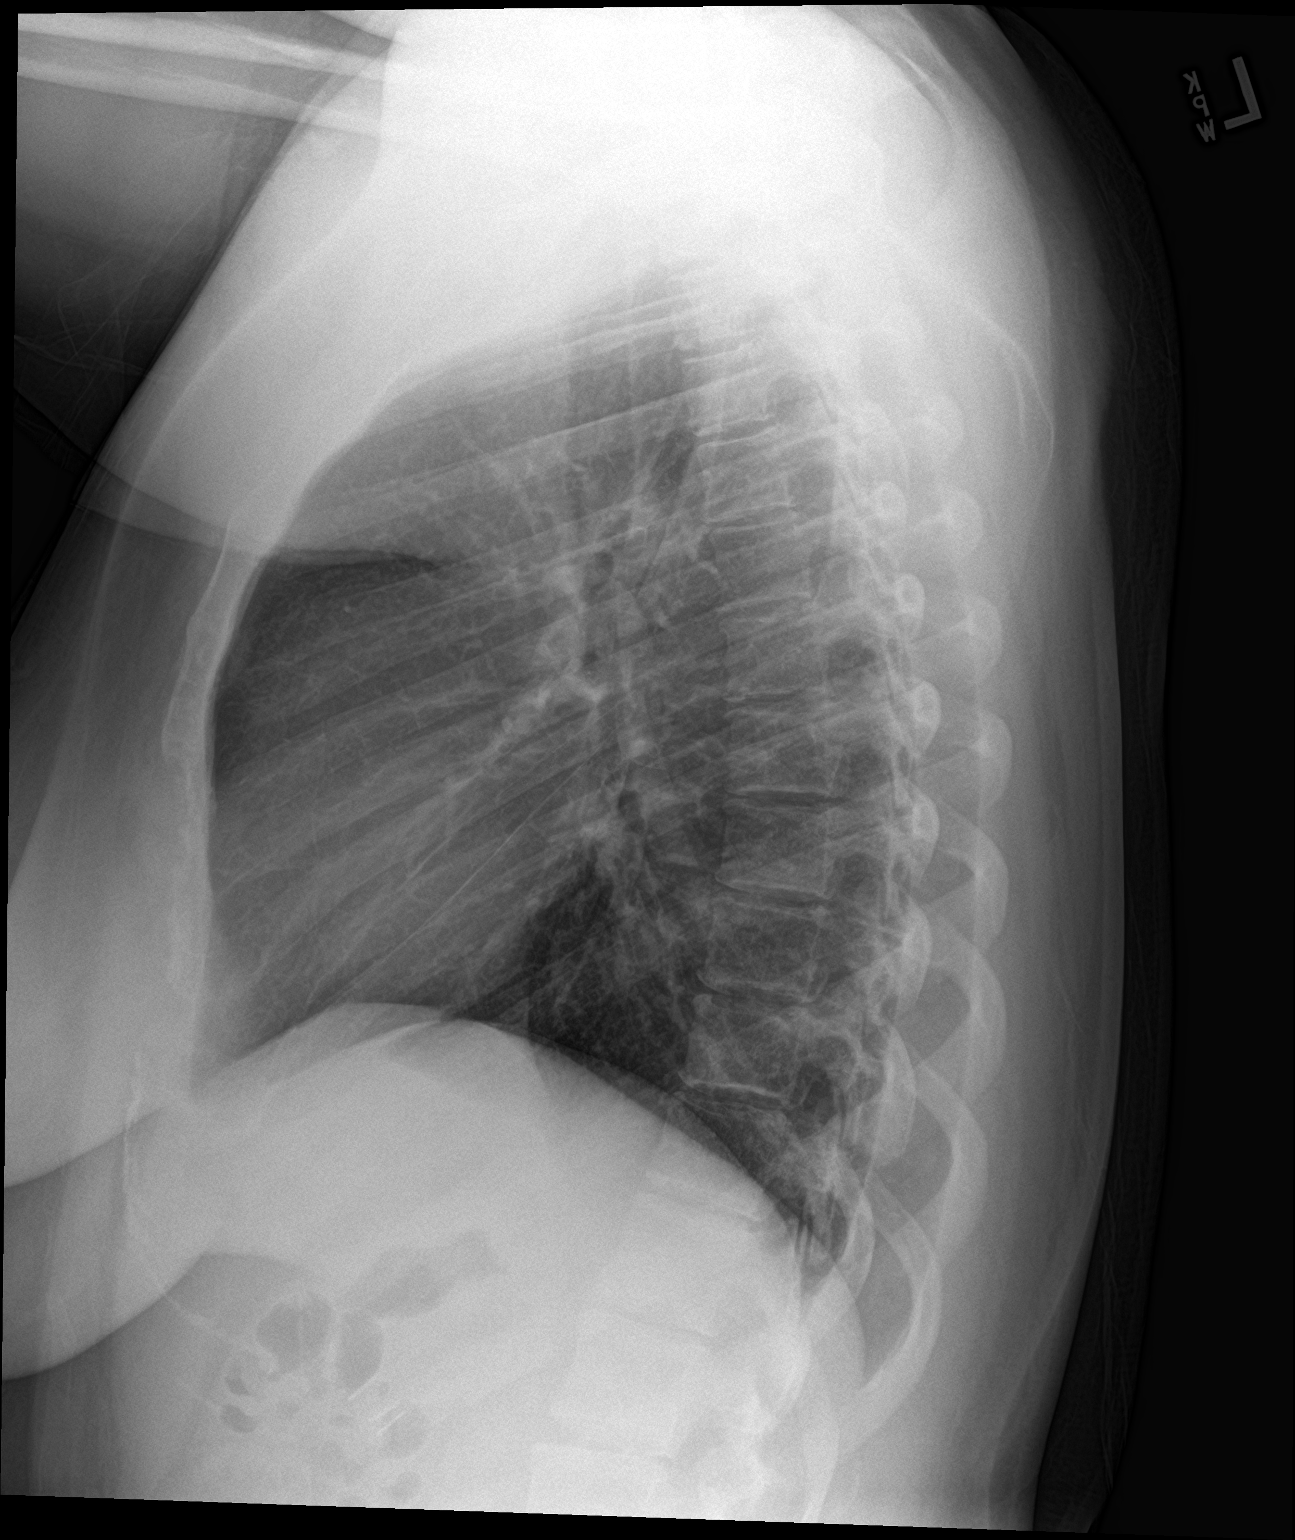

[2 of 2 positions shown; findings below may reference images not displayed]

FINDINGS: The heart size and mediastinal contours are within normal limits.
Both lungs are clear. The visualized skeletal structures are
unremarkable.
IMPRESSION: No active cardiopulmonary disease.

## 2021-01-28 DIAGNOSIS — Z419 Encounter for procedure for purposes other than remedying health state, unspecified: Secondary | ICD-10-CM | POA: Diagnosis not present

## 2021-02-27 DIAGNOSIS — Z419 Encounter for procedure for purposes other than remedying health state, unspecified: Secondary | ICD-10-CM | POA: Diagnosis not present

## 2021-03-30 DIAGNOSIS — Z419 Encounter for procedure for purposes other than remedying health state, unspecified: Secondary | ICD-10-CM | POA: Diagnosis not present

## 2021-04-29 DIAGNOSIS — Z419 Encounter for procedure for purposes other than remedying health state, unspecified: Secondary | ICD-10-CM | POA: Diagnosis not present

## 2021-05-30 DIAGNOSIS — Z419 Encounter for procedure for purposes other than remedying health state, unspecified: Secondary | ICD-10-CM | POA: Diagnosis not present

## 2021-06-30 DIAGNOSIS — Z419 Encounter for procedure for purposes other than remedying health state, unspecified: Secondary | ICD-10-CM | POA: Diagnosis not present

## 2021-07-11 DIAGNOSIS — J40 Bronchitis, not specified as acute or chronic: Secondary | ICD-10-CM | POA: Diagnosis not present

## 2021-07-11 DIAGNOSIS — Z20822 Contact with and (suspected) exposure to covid-19: Secondary | ICD-10-CM | POA: Diagnosis not present

## 2021-07-30 DIAGNOSIS — Z419 Encounter for procedure for purposes other than remedying health state, unspecified: Secondary | ICD-10-CM | POA: Diagnosis not present

## 2021-08-30 DIAGNOSIS — Z419 Encounter for procedure for purposes other than remedying health state, unspecified: Secondary | ICD-10-CM | POA: Diagnosis not present

## 2021-09-29 DIAGNOSIS — Z419 Encounter for procedure for purposes other than remedying health state, unspecified: Secondary | ICD-10-CM | POA: Diagnosis not present

## 2021-10-30 DIAGNOSIS — Z419 Encounter for procedure for purposes other than remedying health state, unspecified: Secondary | ICD-10-CM | POA: Diagnosis not present

## 2021-11-30 DIAGNOSIS — Z419 Encounter for procedure for purposes other than remedying health state, unspecified: Secondary | ICD-10-CM | POA: Diagnosis not present

## 2021-12-28 DIAGNOSIS — Z419 Encounter for procedure for purposes other than remedying health state, unspecified: Secondary | ICD-10-CM | POA: Diagnosis not present

## 2022-01-28 DIAGNOSIS — Z419 Encounter for procedure for purposes other than remedying health state, unspecified: Secondary | ICD-10-CM | POA: Diagnosis not present

## 2022-02-27 DIAGNOSIS — Z419 Encounter for procedure for purposes other than remedying health state, unspecified: Secondary | ICD-10-CM | POA: Diagnosis not present

## 2022-03-18 ENCOUNTER — Other Ambulatory Visit: Payer: Self-pay

## 2022-03-18 ENCOUNTER — Emergency Department
Admission: EM | Admit: 2022-03-18 | Discharge: 2022-03-18 | Disposition: A | Discharge status: Home / Self Care | Payer: Medicaid Other | Attending: Emergency Medicine | Admitting: Emergency Medicine

## 2022-03-18 DIAGNOSIS — R109 Unspecified abdominal pain: Secondary | ICD-10-CM

## 2022-03-18 DIAGNOSIS — R197 Diarrhea, unspecified: Secondary | ICD-10-CM | POA: Diagnosis not present

## 2022-03-18 DIAGNOSIS — R110 Nausea: Secondary | ICD-10-CM | POA: Diagnosis not present

## 2022-03-18 DIAGNOSIS — R1030 Lower abdominal pain, unspecified: Secondary | ICD-10-CM | POA: Diagnosis not present

## 2022-03-18 LAB — COMPREHENSIVE METABOLIC PANEL
ALT: 15 U/L (ref 0–44)
AST: 15 U/L (ref 15–41)
Albumin: 4 g/dL (ref 3.5–5.0)
Alkaline Phosphatase: 68 U/L (ref 38–126)
Anion gap: 8 (ref 5–15)
BUN: 9 mg/dL (ref 6–20)
CO2: 25 mmol/L (ref 22–32)
Calcium: 9.1 mg/dL (ref 8.9–10.3)
Chloride: 109 mmol/L (ref 98–111)
Creatinine, Ser: 0.63 mg/dL (ref 0.44–1.00)
GFR, Estimated: >60 mL/min (ref >60)
Glucose, Bld: 93 mg/dL (ref 70–99)
Potassium: 3.9 mmol/L (ref 3.5–5.1)
Sodium: 142 mmol/L (ref 135–145)
Total Bilirubin: 0.4 mg/dL (ref 0.3–1.2)
Total Protein: 7.2 g/dL (ref 6.5–8.1)

## 2022-03-18 LAB — URINALYSIS, ROUTINE W REFLEX MICROSCOPIC
Bilirubin Urine: NEGATIVE
Glucose, UA: NEGATIVE mg/dL
Ketones, ur: NEGATIVE mg/dL
Leukocytes,Ua: NEGATIVE
Nitrite: NEGATIVE
Protein, ur: NEGATIVE mg/dL
RBC / HPF: >50 RBC/hpf — ABNORMAL HIGH (ref 0–5)
Specific Gravity, Urine: 1.024 (ref 1.005–1.030)
pH: 6 (ref 5.0–8.0)

## 2022-03-18 LAB — CBC WITH DIFFERENTIAL/PLATELET
Abs Immature Granulocytes: 0.01 10*3/uL (ref 0.00–0.07)
Basophils Absolute: 0 10*3/uL (ref 0.0–0.1)
Basophils Relative: 0 %
Eosinophils Absolute: 0.1 10*3/uL (ref 0.0–0.5)
Eosinophils Relative: 1 %
HCT: 40.3 % (ref 36.0–46.0)
Hemoglobin: 13.2 g/dL (ref 12.0–15.0)
Immature Granulocytes: 0 %
Lymphocytes Relative: 27 %
Lymphs Abs: 2.8 10*3/uL (ref 0.7–4.0)
MCH: 30.5 pg (ref 26.0–34.0)
MCHC: 32.8 g/dL (ref 30.0–36.0)
MCV: 93.1 fL (ref 80.0–100.0)
Monocytes Absolute: 0.6 10*3/uL (ref 0.1–1.0)
Monocytes Relative: 5 %
Neutro Abs: 6.8 10*3/uL (ref 1.7–7.7)
Neutrophils Relative %: 67 %
Platelets: 161 10*3/uL (ref 150–400)
RBC: 4.33 MIL/uL (ref 3.87–5.11)
RDW: 12.9 % (ref 11.5–15.5)
WBC: 10.3 10*3/uL (ref 4.0–10.5)
nRBC: 0 % (ref 0.0–0.2)

## 2022-03-18 LAB — POC URINE PREG, ED: Preg Test, Ur: NEGATIVE

## 2022-03-18 MED ORDER — LOPERAMIDE HCL 2 MG PO TABS: 2.0000 mg | ORAL_TABLET | Freq: Four times a day (QID) | ORAL | 0 refills | Status: DC | PRN

## 2022-03-18 MED ORDER — ONDANSETRON 4 MG PO TBDP: 4.0000 mg | ORAL_TABLET | Freq: Three times a day (TID) | ORAL | 0 refills | Status: DC | PRN

## 2022-03-18 NOTE — ED Notes (Signed)
Discharge instructions including prescription discussed with pt. Pt verbalized understanding with no questions at this time. Pt to go home with s/o at bedside.  

## 2022-03-18 NOTE — ED Triage Notes (Incomplete)
Pt states she got off her period last Saturday. States this pain is worse than a typical period and that she is not on any birth control. Reports spotting but no pain with urination

## 2022-03-18 NOTE — ED Provider Notes (Signed)
   [facility] Provider Note    Event Date/Time   First MD Initiated Contact with Patient 03/18/22 0335     (approximate)  History   Chief Complaint: Abdominal Cramping and Pelvic Pain  HPI  Gabrielle Delacruz is a 26 y.o. female with no significant past medical history who presents to the emergency department for lower abdominal cramping and diarrhea.  According to the patient over the past day or 2 she has been experiencing intermittent nausea as well as some loose stool.  States she has been experiencing intermittent abdominal cramping as well.  No fever.  No dysuria, no vaginal discharge, no concern for STDs.  Patient just finished her menstrual cycle.  Physical Exam   Triage Vital Signs: ED Triage Vitals  Enc Vitals Group     BP 03/18/22 0042 122/88     Pulse Rate 03/18/22 0042 73     Resp 03/18/22 0042 18     Temp 03/18/22 0042 97.7 F (36.5 C)     Temp Source 03/18/22 0042 Oral     SpO2 03/18/22 0042 97 %     Weight 03/18/22 0043 210 lb 1.6 oz (95.3 kg)     Height 03/18/22 0043 5\' 3"  (1.6 m)     Head Circumference --      Peak Flow --      Pain Score 03/18/22 0043 8     Pain Loc --      Pain Edu? --      Excl. in GC? --     Most recent vital signs: Vitals:   03/18/22 0042 03/18/22 0336  BP: 122/88 105/67  Pulse: 73 60  Resp: 18 15  Temp: 97.7 F (36.5 C) 98 F (36.7 C)  SpO2: 97% 100%    General: Awake, no distress.  CV:  Good peripheral perfusion.  Regular rate and rhythm  Resp:  Normal effort.  Equal breath sounds bilaterally.  Abd:  No distention.  Soft, nontender.  No rebound or guarding.  Benign abdomen.    ED Results / Procedures / Treatments    MEDICATIONS ORDERED IN ED: Medications - No data to display   IMPRESSION / MDM / ASSESSMENT AND PLAN / ED COURSE  I reviewed the triage vital signs and the nursing notes.  Patient presents to the emergency department for loose stool and abdominal discomfort/cramping.  Overall the  patient appears well, benign abdominal exam.  Patient's lab work is reassuring showing a normal CBC normal chemistry nonrevealing urinalysis and a negative pregnancy test.  Given the patient's reassuring work-up reassuring vitals and reassuring physical exam I believe the patient will be safe for discharge home.  I discussed with the patient supportive care such as Imodium and Zofran as needed.  I also discussed return precautions for any worsening pain or development of fever.  Patient agreeable to plan of care.  FINAL CLINICAL IMPRESSION(S) / ED DIAGNOSES   Diarrhea  Rx / DC Orders   Zofran Loperamide  Note:  This document was prepared using Dragon voice recognition software and may include unintentional dictation errors.   03/20/22, MD 03/18/22 [phone]

## 2022-03-30 DIAGNOSIS — Z419 Encounter for procedure for purposes other than remedying health state, unspecified: Secondary | ICD-10-CM | POA: Diagnosis not present

## 2022-04-29 DIAGNOSIS — Z419 Encounter for procedure for purposes other than remedying health state, unspecified: Secondary | ICD-10-CM | POA: Diagnosis not present

## 2022-05-30 DIAGNOSIS — Z419 Encounter for procedure for purposes other than remedying health state, unspecified: Secondary | ICD-10-CM | POA: Diagnosis not present

## 2022-06-30 DIAGNOSIS — Z419 Encounter for procedure for purposes other than remedying health state, unspecified: Secondary | ICD-10-CM | POA: Diagnosis not present

## 2022-07-30 DIAGNOSIS — Z419 Encounter for procedure for purposes other than remedying health state, unspecified: Secondary | ICD-10-CM | POA: Diagnosis not present

## 2022-08-30 DIAGNOSIS — Z419 Encounter for procedure for purposes other than remedying health state, unspecified: Secondary | ICD-10-CM | POA: Diagnosis not present

## 2022-09-29 DIAGNOSIS — Z419 Encounter for procedure for purposes other than remedying health state, unspecified: Secondary | ICD-10-CM | POA: Diagnosis not present

## 2022-10-30 DIAGNOSIS — Z419 Encounter for procedure for purposes other than remedying health state, unspecified: Secondary | ICD-10-CM | POA: Diagnosis not present

## 2022-11-30 DIAGNOSIS — Z419 Encounter for procedure for purposes other than remedying health state, unspecified: Secondary | ICD-10-CM | POA: Diagnosis not present

## 2022-12-29 DIAGNOSIS — Z419 Encounter for procedure for purposes other than remedying health state, unspecified: Secondary | ICD-10-CM | POA: Diagnosis not present

## 2023-01-29 DIAGNOSIS — Z419 Encounter for procedure for purposes other than remedying health state, unspecified: Secondary | ICD-10-CM | POA: Diagnosis not present

## 2023-02-09 ENCOUNTER — Other Ambulatory Visit: Payer: Self-pay

## 2023-02-09 DIAGNOSIS — N611 Abscess of the breast and nipple: Secondary | ICD-10-CM | POA: Diagnosis not present

## 2023-02-09 NOTE — ED Triage Notes (Signed)
Small abscess to R breast. Reports first noted 1 wk ago. Hx of perineal abscess as a child. Denies fevers or drainage from area. Pt ambulatory to triage. Alert and oriented following commands. Breathing unlabored with symmetric chest rise and fall.

## 2023-02-10 ENCOUNTER — Emergency Department
Admission: EM | Admit: 2023-02-10 | Discharge: 2023-02-10 | Disposition: A | Discharge status: Home / Self Care | Payer: Medicaid Other | Attending: Emergency Medicine | Admitting: Emergency Medicine

## 2023-02-10 DIAGNOSIS — N611 Abscess of the breast and nipple: Secondary | ICD-10-CM

## 2023-02-10 LAB — AEROBIC CULTURE W GRAM STAIN (SUPERFICIAL SPECIMEN)

## 2023-02-10 MED ORDER — CEPHALEXIN 500 MG PO CAPS: 500.0000 mg | ORAL_CAPSULE | Freq: Four times a day (QID) | ORAL | 0 refills | Status: AC

## 2023-02-10 MED ORDER — PROBIOTIC 1-250 BILLION-MG PO CAPS: 1.0000 | ORAL_CAPSULE | Freq: Two times a day (BID) | ORAL | 0 refills | Status: DC

## 2023-02-10 MED ORDER — LIDOCAINE HCL (PF) 1 % IJ SOLN
5.0000 mL | Freq: Once | INTRAMUSCULAR | Status: AC
  Administered 2023-02-10: 5 mL via INTRADERMAL
  Filled 2023-02-10: qty 5

## 2023-02-10 MED ORDER — CEPHALEXIN 500 MG PO CAPS
500.0000 mg | ORAL_CAPSULE | Freq: Once | ORAL | Status: AC
  Administered 2023-02-10: 500 mg via ORAL
  Filled 2023-02-10: qty 1

## 2023-02-10 NOTE — Discharge Instructions (Signed)
You may alternate Tylenol 1000 mg every 6 hours as needed for pain, fever and Ibuprofen 800 mg every 6-8 hours as needed for pain, fever.  Please take Ibuprofen with food.  Do not take more than 4000 mg of Tylenol (acetaminophen) in a 24 hour period.   Please go to the following website to schedule new (and existing) patient appointments:   [email or web address]   The following is a list of primary care offices in the area who are accepting new patients at this time.  Please reach out to one of them directly and let them know you would like to schedule an appointment to follow up on an Emergency Department visit, and/or to establish a new primary care provider (PCP).  There are likely other primary care clinics in the are who are accepting new patients, but this is an excellent place to start:  [facility] Lead physician: Dr Angela Bacigalupo [address] [place], [place] [address] [phone]  [facility] Lead Physician: Dr Krichna Sowles [address], [place], [place] [address] [phone]  [facility]  Lead Physician: Dr Megan Johnson [address], [place], [place] [address] [phone]  [facility] [place] [facility] Lead Physician: Dr Alex Karamalegos [address], [place], [place] [address] [phone]  [facility] [place] Lead Physician: Dr Laura Berglund [address], [place], [place] [address] [phone]  

## 2023-02-10 NOTE — ED Provider Notes (Signed)
[facility] Provider Note    Event Date/Time   First MD Initiated Contact with Patient 02/10/23 0117     (approximate)   History   Abscess   HPI  Gabrielle Delacruz is a 27 y.o. female with no significant past medical history who presents to the emergency department with a superficial abscess just underneath the right breast.  No drainage.  No fevers, vomiting.  No diabetes history.   History provided by patient.    History reviewed. No pertinent past medical history.  Past Surgical History:  Procedure Laterality Date   CHOLECYSTECTOMY      MEDICATIONS:  Prior to Admission medications   Medication Sig Start Date End Date Taking? Authorizing Provider  loperamide (IMODIUM A-D) 2 MG tablet Take 1 tablet (2 mg total) by mouth 4 (four) times daily as needed for diarrhea or loose stools. 03/18/22   Minna Antis, MD  ondansetron (ZOFRAN-ODT) 4 MG disintegrating tablet Take 1 tablet (4 mg total) by mouth every 8 (eight) hours as needed for nausea or vomiting. 03/18/22   Minna Antis, MD    Physical Exam   Triage Vital Signs: ED Triage Vitals  Enc Vitals Group     BP 02/09/23 2254 119/70     Pulse Rate 02/09/23 2254 84     Resp 02/09/23 2254 18     Temp 02/09/23 2254 98.5 F (36.9 C)     Temp Source 02/09/23 2254 Oral     SpO2 02/09/23 2254 100 %     Weight 02/09/23 2255 185 lb (83.9 kg)     Height 02/09/23 2255 5\' 3"  (1.6 m)     Head Circumference --      Peak Flow --      Pain Score 02/09/23 2255 6     Pain Loc --      Pain Edu? --      Excl. in GC? --     Most recent vital signs: Vitals:   02/09/23 2254  BP: 119/70  Pulse: 84  Resp: 18  Temp: 98.5 F (36.9 C)  SpO2: 100%     CONSTITUTIONAL: Alert and responds appropriately to questions. Well-appearing; well-nourished HEAD: Normocephalic, atraumatic EYES: Conjunctivae clear, pupils appear equal ENT: normal nose; moist mucous membranes NECK: Normal range of motion CARD:  Regular rate and rhythm BREAST: Patient has superficial raised area approximately 2 x 2 cm just underneath the right breast at the 7 o'clock position with minimal surrounding redness and warmth but no fluctuance or induration.  No breast masses otherwise breast tissue appears normal.  Nipple is normal without drainage. RESP: Normal chest excursion without splinting or tachypnea; no hypoxia or respiratory distress, speaking full sentences ABD/GI: non-distended EXT: Normal ROM in all joints, no major deformities noted SKIN: Normal color for age and race, no rashes on exposed skin NEURO: Moves all extremities equally, normal speech, no facial asymmetry noted PSYCH: The patient's mood and manner are appropriate. Grooming and personal hygiene are appropriate.    RIGHT breast   Patient gave verbal permission to utilize photo for medical documentation only. The image was not stored on any personal device.   ED Results / Procedures / Treatments   LABS: (all labs ordered are listed, but only abnormal results are displayed) Labs Reviewed  AEROBIC CULTURE W GRAM STAIN (SUPERFICIAL SPECIMEN)     EKG:   RADIOLOGY: My personal review and interpretation of imaging:    I have personally reviewed all radiology reports. No results found.  PROCEDURES:  Critical Care performed: No INCISION AND DRAINAGE Performed by: Baxter Hire Rondo Spittler Consent: Verbal consent obtained. Risks and benefits: risks, benefits and alternatives were discussed Type: abscess  Body area: right breast  Anesthesia: local infiltration  Small superficial stab incision was made with a scalpel.  Local anesthetic: lidocaine 1% without epinephrine  Anesthetic total: 3 ml  Complexity:simple, no blunt dissection  Drainage: Minimal purulence but mostly drainage appeared to be sebaceous in nature  Drainage amount: small  Packing material: none  Patient tolerance: Patient tolerated the procedure well with no immediate  complications.     Procedures    IMPRESSION / MDM / ASSESSMENT AND PLAN / ED COURSE  I reviewed the triage vital signs and the nursing notes.   Patient here with superficial abscess to the right breast with minimal surrounding redness and warmth.     DIFFERENTIAL DIAGNOSIS (includes but not limited to):   Abscess, infected sebaceous cyst, doubt breast malignancy, mastitis as patient is not breast-feeding  Patient's presentation is most consistent with acute complicated illness / injury requiring diagnostic workup.  PLAN: Patient here with extremely superficial abscess to the right breast.  I suspect that she has a sebaceous cyst that is infected.  We discussed that with deeper or larger abscesses we do not normally like to drain them from the ER but have her follow-up with breast surgery but given this is so superficial I feel it would be amendable to a small superficial stab incision without exploration.  She is comfortable with this plan.  Will go ahead and start her on Keflex as well and send wound culture.   MEDICATIONS GIVEN IN ED: Medications  lidocaine (PF) (XYLOCAINE) 1 % injection 5 mL (5 mLs Intradermal Given 02/10/23 0147)  cephALEXin (KEFLEX) capsule 500 mg (500 mg Oral Given 02/10/23 0147)     ED COURSE: Patient did indeed appear to have an infected sebaceous cyst based on the contents that came out of the stab incision.  Will discharge with Keflex and wound care instructions.  Recommend Tylenol, Motrin for pain control.   At this time, I do not feel there is any life-threatening condition present. I reviewed all nursing notes, vitals, pertinent previous records.  All lab and urine results, EKGs, imaging ordered have been independently reviewed and interpreted by myself.  I reviewed all available radiology reports from any imaging ordered this visit.  Based on my assessment, I feel the patient is safe to be discharged home without further emergent workup and can continue  workup as an outpatient as needed. Discussed all findings, treatment plan as well as usual and customary return precautions.  They verbalize understanding and are comfortable with this plan.  Outpatient follow-up has been provided as needed.  All questions have been answered.    CONSULTS:  none   OUTSIDE RECORDS REVIEWED: Reviewed last internal medicine note on 09/14/2020.     FINAL CLINICAL IMPRESSION(S) / ED DIAGNOSES   Final diagnoses:  Breast abscess of female     Rx / DC Orders   ED Discharge Orders          Ordered    cephALEXin (KEFLEX) 500 MG capsule  4 times daily        02/10/23 0259    Bacillus Coagulans-Inulin (PROBIOTIC) 1-250 BILLION-MG CAPS  2 times daily        02/10/23 0259             Note:  This document was prepared using Dragon voice recognition  software and may include unintentional dictation errors.   Ezzie Senat, Layla Maw, DO 02/10/23 [phone]

## 2023-02-11 LAB — AEROBIC CULTURE W GRAM STAIN (SUPERFICIAL SPECIMEN)

## 2023-02-12 LAB — AEROBIC CULTURE W GRAM STAIN (SUPERFICIAL SPECIMEN)

## 2023-02-13 LAB — AEROBIC CULTURE W GRAM STAIN (SUPERFICIAL SPECIMEN)

## 2023-02-14 ENCOUNTER — Telehealth: Payer: Self-pay | Admitting: Pharmacist

## 2023-02-14 MED ORDER — DOXYCYCLINE HYCLATE 100 MG PO TABS: 100.0000 mg | ORAL_TABLET | Freq: Two times a day (BID) | ORAL | 0 refills | Status: AC

## 2023-02-14 NOTE — Telephone Encounter (Signed)
Cultures results discussed with DR Cyril Loosen Order to dc Keflex and start Doxycycline  PO BID x 7 days  Patient called and informed of results and new orders. She agreed with new RX send to prefer pharmacy [facility].  Denies fever, pain, chills. She is to come back to ED is any if symptoms of worsening infection noted.   Specimen Description ABSCESS Performed at [facility], [address] [place], [place] [address]  Special Requests NONE Performed at [facility], [address] [place], [place] [address]  Gram Stain FEW WBC PRESENT,BOTH PMN AND MONONUCLEAR NO ORGANISMS SEEN Performed at [facility], [address], [place], [place] [address]  Culture RARE STAPHYLOCOCCUS LUGDUNENSIS  Report Status 02/13/2023 FINAL  Organism ID, Bacteria STAPHYLOCOCCUS LUGDUNENSIS  Resulting Agency [facility]     Susceptibility   Staphylococcus lugdunensis    MIC    CIPROFLOXACIN <=0.5 SENSI... Sensitive    CLINDAMYCIN <=0.25 SENS... Sensitive    ERYTHROMYCIN <=0.25 SENS... Sensitive    GENTAMICIN <=0.5 SENSI... Sensitive    Inducible Clindamycin NEGATIVE Sensitive    OXACILLIN 1 SENSITIVE Sensitive    RIFAMPIN <=0.5 SENSI... Sensitive    TETRACYCLINE <=1 SENSITIVE Sensitive    TRIMETH/SULFA <=10 SENSIT... Sensitive    VANCOMYCIN <=0.5 SENSI... Sensitive           Dyson Sevey Rodriguez-Guzman PharmD, BCPS 02/14/2023 2:38 PM

## 2023-02-28 DIAGNOSIS — Z419 Encounter for procedure for purposes other than remedying health state, unspecified: Secondary | ICD-10-CM | POA: Diagnosis not present

## 2023-03-31 DIAGNOSIS — Z419 Encounter for procedure for purposes other than remedying health state, unspecified: Secondary | ICD-10-CM | POA: Diagnosis not present

## 2023-04-30 DIAGNOSIS — Z419 Encounter for procedure for purposes other than remedying health state, unspecified: Secondary | ICD-10-CM | POA: Diagnosis not present

## 2023-05-31 DIAGNOSIS — Z419 Encounter for procedure for purposes other than remedying health state, unspecified: Secondary | ICD-10-CM | POA: Diagnosis not present

## 2023-07-01 DIAGNOSIS — Z419 Encounter for procedure for purposes other than remedying health state, unspecified: Secondary | ICD-10-CM | POA: Diagnosis not present

## 2023-07-31 DIAGNOSIS — Z419 Encounter for procedure for purposes other than remedying health state, unspecified: Secondary | ICD-10-CM | POA: Diagnosis not present

## 2023-08-31 DIAGNOSIS — Z419 Encounter for procedure for purposes other than remedying health state, unspecified: Secondary | ICD-10-CM | POA: Diagnosis not present

## 2023-09-15 DIAGNOSIS — B349 Viral infection, unspecified: Secondary | ICD-10-CM | POA: Diagnosis not present

## 2023-09-15 DIAGNOSIS — Z20822 Contact with and (suspected) exposure to covid-19: Secondary | ICD-10-CM | POA: Diagnosis not present

## 2023-09-15 DIAGNOSIS — R059 Cough, unspecified: Secondary | ICD-10-CM | POA: Diagnosis not present

## 2023-09-30 DIAGNOSIS — Z419 Encounter for procedure for purposes other than remedying health state, unspecified: Secondary | ICD-10-CM | POA: Diagnosis not present

## 2023-10-31 DIAGNOSIS — Z419 Encounter for procedure for purposes other than remedying health state, unspecified: Secondary | ICD-10-CM | POA: Diagnosis not present

## 2023-12-01 DIAGNOSIS — Z419 Encounter for procedure for purposes other than remedying health state, unspecified: Secondary | ICD-10-CM | POA: Diagnosis not present

## 2023-12-29 DIAGNOSIS — Z419 Encounter for procedure for purposes other than remedying health state, unspecified: Secondary | ICD-10-CM | POA: Diagnosis not present

## 2024-02-09 DIAGNOSIS — Z419 Encounter for procedure for purposes other than remedying health state, unspecified: Secondary | ICD-10-CM | POA: Diagnosis not present

## 2024-03-10 DIAGNOSIS — Z419 Encounter for procedure for purposes other than remedying health state, unspecified: Secondary | ICD-10-CM | POA: Diagnosis not present

## 2024-04-10 DIAGNOSIS — Z419 Encounter for procedure for purposes other than remedying health state, unspecified: Secondary | ICD-10-CM | POA: Diagnosis not present

## 2024-05-10 DIAGNOSIS — Z419 Encounter for procedure for purposes other than remedying health state, unspecified: Secondary | ICD-10-CM | POA: Diagnosis not present

## 2024-06-10 DIAGNOSIS — Z419 Encounter for procedure for purposes other than remedying health state, unspecified: Secondary | ICD-10-CM | POA: Diagnosis not present

## 2024-07-11 DIAGNOSIS — Z419 Encounter for procedure for purposes other than remedying health state, unspecified: Secondary | ICD-10-CM | POA: Diagnosis not present

## 2024-08-28 ENCOUNTER — Other Ambulatory Visit (HOSPITAL_COMMUNITY)
Admission: RE | Admit: 2024-08-28 | Discharge: 2024-08-28 | Disposition: A | Discharge status: Home / Self Care | Source: Ambulatory Visit

## 2024-08-28 ENCOUNTER — Ambulatory Visit (INDEPENDENT_AMBULATORY_CARE_PROVIDER_SITE_OTHER)

## 2024-08-28 VITALS — BP 103/63 | HR 66 | Resp 16 | Ht 64.0 in | Wt 190.0 lb

## 2024-08-28 DIAGNOSIS — Z113 Encounter for screening for infections with a predominantly sexual mode of transmission: Secondary | ICD-10-CM | POA: Insufficient documentation

## 2024-08-28 DIAGNOSIS — Z23 Encounter for immunization: Secondary | ICD-10-CM | POA: Diagnosis not present

## 2024-08-28 DIAGNOSIS — R102 Pelvic and perineal pain unspecified side: Secondary | ICD-10-CM | POA: Diagnosis not present

## 2024-08-28 DIAGNOSIS — L989 Disorder of the skin and subcutaneous tissue, unspecified: Secondary | ICD-10-CM | POA: Diagnosis not present

## 2024-08-28 DIAGNOSIS — F17200 Nicotine dependence, unspecified, uncomplicated: Secondary | ICD-10-CM | POA: Diagnosis not present

## 2024-08-28 MED ORDER — VARENICLINE TARTRATE (STARTER) 0.5 MG X 11 & 1 MG X 42 PO TBPK
ORAL_TABLET | ORAL | 0 refills | Status: AC
Start: 2024-08-28 — End: ?

## 2024-08-28 NOTE — Progress Notes (Signed)
 New patient visit   Patient: Gabrielle Delacruz   DOB: 1995-11-23   28 y.o. Female  MRN: [id number] Visit Date: 08/28/2024  Today's healthcare provider: Isaiah DELENA Pepper, MD   Chief Complaint  Patient presents with   New Patient (Initial Visit)    NP/Est Care/ cramping ( referral to Obgyn) Concerns about wanting to quit something. Yes to all vaccines that's avail.   Subjective    Gabrielle Delacruz is a 29 y.o. female who presents today as a new patient to establish care.   Discussed the use of AI scribe software for clinical note transcription with the patient, who gave verbal consent to proceed.  History of Present Illness Gabrielle Delacruz is a 28 year old female who presents with desire to quit smoking and post-coital cramping.  She experiences mild cramping after sexual intercourse. The cramping does not occur during intercourse but is pronounced afterward. She has noticed an increase in vaginal discharge but denies any itching or burning. She has not had a Pap smear before and has not been tested for STDs in the past year or two. She is currently sexually active. She is not currently on any birth control and is not interested in starting any. Periods have been regular recently.  Her past medical history includes gallbladder removal at age 17.  She has been taking ibuprofen  regularly for cramping but prefers not to take medications frequently.  Her family history includes her mother with hx of melanoma, a grandmother with an unspecified cancer and diabetes, an uncle with epilepsy, an aunt with lupus, and a biological father who had a stroke. She is concerned about a small, persistent bump on her skin that has been present for at least six years.  She smokes a pack of cigarettes a day and uses marijuana but does not consume alcohol frequently. She wants to quit smoking, primarily due to concerns about her teeth staining and overall appearance. She has not tried nicotine replacement therapies or  medications to quit smoking in the past.   History reviewed. No pertinent past medical history. Past Surgical History:  Procedure Laterality Date   CHOLECYSTECTOMY     No family status information on file.   History reviewed. No pertinent family history. Social History   Socioeconomic History   Marital status: Single    Spouse name: Not on file   Number of children: Not on file   Years of education: Not on file   Highest education level: Not on file  Occupational History   Not on file  Tobacco Use   Smoking status: Every Day    Current packs/day: 1.00    Types: Cigarettes   Smokeless tobacco: Never  Vaping Use   Vaping status: Never Used  Substance and Sexual Activity   Alcohol use: Yes    Comment: occasionally   Drug use: Not Currently    Types: Marijuana   Sexual activity: Yes    Partners: Male    Birth control/protection: Condom  Other Topics Concern   Not on file  Social History Narrative   Not on file   Social Drivers of Health   Financial Resource Strain: Not on file  Food Insecurity: Not on file  Transportation Needs: Not on file  Physical Activity: Not on file  Stress: Not on file  Social Connections: Not on file   Outpatient Medications Prior to Visit  Medication Sig   [DISCONTINUED] Bacillus Coagulans-Inulin (PROBIOTIC) 1-250 BILLION-MG CAPS Take 1 capsule by mouth 2 (two) times  daily. (Patient not taking: Reported on 08/28/2024)   [DISCONTINUED] loperamide  (IMODIUM  A-D) 2 MG tablet Take 1 tablet (2 mg total) by mouth 4 (four) times daily as needed for diarrhea or loose stools. (Patient not taking: Reported on 08/28/2024)   [DISCONTINUED] ondansetron  (ZOFRAN -ODT) 4 MG disintegrating tablet Take 1 tablet (4 mg total) by mouth every 8 (eight) hours as needed for nausea or vomiting. (Patient not taking: Reported on 08/28/2024)   No facility-administered medications prior to visit.   Allergies  Allergen Reactions   Bee Venom Rash    Reviews of  Systems as noted in HPI.      Objective    BP 103/63 (BP Location: Right Arm, Patient Position: Sitting)   Pulse 66   Resp 16   Ht 5' 4 (1.626 m)   Wt 190 lb (86.2 kg)   SpO2 99%   BMI 32.61 kg/m     Physical Exam Constitutional:      Appearance: Normal appearance.  HENT:     Head: Normocephalic and atraumatic.     Mouth/Throat:     Mouth: Mucous membranes are moist.  Eyes:     Pupils: Pupils are equal, round, and reactive to light.  Cardiovascular:     Rate and Rhythm: Normal rate and regular rhythm.     Heart sounds: Normal heart sounds.  Pulmonary:     Effort: Pulmonary effort is normal.  Skin:    General: Skin is warm.     Findings: Lesion present.     Comments: ~51m hyperpigmented papule present on upper back. No surround erythema, tenderness, or swelling.  Neurological:     General: No focal deficit present.     Mental Status: She is alert.     Depression Screen     No data to display         No results found for any visits on 08/28/24.  Assessment & Plan      Problem List Items Addressed This Visit       Musculoskeletal and Integument   Skin lesion   Relevant Orders   Ambulatory referral to Dermatology     Other   Pelvic pain - Primary   Other Visit Diagnoses       Tobacco use disorder       Relevant Medications   Varenicline Tartrate, Starter, (CHANTIX STARTING MONTH PAK) 0.5 MG X 11 & 1 MG X 42 TBPK   Other Relevant Orders   Ambulatory referral to Virtual Care Smoking Cessation     Screening for STD (sexually transmitted disease)       Relevant Orders   HIV Antibody (routine testing w rflx)   Cervicovaginal ancillary only   RPR     Need for vaccination       Relevant Orders   Flu vaccine trivalent PF, 6mos and older(Flulaval,Afluria,Fluarix,Fluzone)   Pneumococcal conjugate vaccine 20-valent      Assessment & Plan Post-coital cramping Chronic. Patient with occasional post-coital cramping. Differential includes physiologic  uterine contractions, cervicitis, fibroids, ovarian cysts. No recent Pap smear or STD testing. No birth control use. - Order STD screening: chlamydia, gonorrhea, HIV, syphilis. - Schedule Pap smear. - Consider pelvic US  if worsening  Tobacco Use Disorder Smoking one pack daily. Desires cessation due to cosmetic and financial reasons. Interested in Chantix and counseling. Counseled patient on smoking cessation for 5 minutes. Utilized the 5 As to encourage patient to quit smoking.  - Prescribe Chantix with titration. - Refer to [facility] tobacco cessation  program. - Discussed nicotine replacement therapy.  Skin lesion Chronic. ~1cm hyperpigmented papule present on upper back. Not not changed in size or apperance for 6 years. Ddx includes epidermoid cyst vs dermatofibroma. Does have family history of melanoma. - Refer to [facility] for evaluation and yearly exams.  General Health Maintenance Incomplete vaccination history. Smoking increases pneumonia risk. No recent flu or pneumonia vaccines. - Administer flu vaccine. - Administer pneumonia vaccine. - Encourage COVID-19 vaccination at local pharmacies.   Return in about 5 weeks (around 10/02/2024) for pap smear.      Isaiah DELENA Pepper, MD  [facility] [phone] (phone) [phone] (fax)

## 2024-08-29 ENCOUNTER — Ambulatory Visit: Payer: Self-pay

## 2024-08-29 ENCOUNTER — Other Ambulatory Visit: Payer: Self-pay

## 2024-08-29 DIAGNOSIS — A5901 Trichomonal vulvovaginitis: Secondary | ICD-10-CM

## 2024-08-29 DIAGNOSIS — B3731 Acute candidiasis of vulva and vagina: Secondary | ICD-10-CM

## 2024-08-29 LAB — CERVICOVAGINAL ANCILLARY ONLY
Bacterial Vaginitis (gardnerella): NEGATIVE
Candida Glabrata: NEGATIVE
Candida Vaginitis: POSITIVE — AB
Chlamydia: NEGATIVE
Comment: NEGATIVE
Comment: NEGATIVE
Comment: NEGATIVE
Comment: NEGATIVE
Comment: NEGATIVE
Comment: NORMAL
Neisseria Gonorrhea: NEGATIVE
Trichomonas: POSITIVE — AB

## 2024-08-29 LAB — RPR: RPR Ser Ql: NONREACTIVE

## 2024-08-29 LAB — HIV ANTIBODY (ROUTINE TESTING W REFLEX): HIV Screen 4th Generation wRfx: NONREACTIVE

## 2024-08-29 MED ORDER — FLUCONAZOLE 150 MG PO TABS
150.0000 mg | ORAL_TABLET | Freq: Once | ORAL | 0 refills | Status: AC
Start: 2024-08-29 — End: 2024-08-29

## 2024-08-29 MED ORDER — METRONIDAZOLE 500 MG PO TABS
500.0000 mg | ORAL_TABLET | Freq: Two times a day (BID) | ORAL | 0 refills | Status: AC
Start: 2024-08-29 — End: ?

## 2024-09-17 ENCOUNTER — Ambulatory Visit

## 2024-09-17 DIAGNOSIS — L732 Hidradenitis suppurativa: Secondary | ICD-10-CM | POA: Diagnosis not present

## 2024-09-17 DIAGNOSIS — D239 Other benign neoplasm of skin, unspecified: Secondary | ICD-10-CM | POA: Diagnosis not present

## 2024-09-17 DIAGNOSIS — Z808 Family history of malignant neoplasm of other organs or systems: Secondary | ICD-10-CM | POA: Diagnosis not present

## 2024-09-17 DIAGNOSIS — D229 Melanocytic nevi, unspecified: Secondary | ICD-10-CM | POA: Diagnosis not present

## 2024-09-17 MED ORDER — CLINDAMYCIN PHOSPHATE 1 % EX SWAB: 1.0000 | Freq: Two times a day (BID) | CUTANEOUS | 5 refills | Status: AC

## 2024-09-17 NOTE — Patient Instructions (Addendum)
 Recommend OTC Panoyxl (will bleach clothing) or Hibiclens. Let sit 5 mins then rinse.        Due to recent changes in healthcare laws, you may see results of your pathology and/or laboratory studies on MyChart before the doctors have had a chance to review them. We understand that in some cases there may be results that are confusing or concerning to you. Please understand that not all results are received at the same time and often the doctors may need to interpret multiple results in order to provide you with the best plan of care or course of treatment. Therefore, we ask that you please give us  2 business days to thoroughly review all your results before contacting the office for clarification. Should we see a critical lab result, you will be contacted sooner.   If You Need Anything After Your Visit  If you have any questions or concerns for your doctor, please call our main line at [phone] and press option 4 to reach your doctor's medical assistant. If no one answers, please leave a voicemail as directed and we will return your call as soon as possible. Messages left after 4 pm will be answered the following business day.   You may also send us  a message via MyChart. We typically respond to MyChart messages within 1-2 business days.  For prescription refills, please ask your pharmacy to contact our office. Our fax number is [phone].  If you have an urgent issue when the clinic is closed that cannot wait until the next business day, you can page your doctor at the number below.    Please note that while we do our best to be available for urgent issues outside of office hours, we are not available 24/7.   If you have an urgent issue and are unable to reach us , you may choose to seek medical care at your doctor's office, retail clinic, urgent care center, or emergency room.  If you have a medical emergency, please immediately call 911 or go to the emergency department.  Pager  Numbers  - Dr. Hester: [phone]  - Dr. Jackquline: [phone]  - Dr. Claudene: [phone]   In the event of inclement weather, please call our main line at [phone] for an update on the status of any delays or closures.  Dermatology Medication Tips: Please keep the boxes that topical medications come in in order to help keep track of the instructions about where and how to use these. Pharmacies typically print the medication instructions only on the boxes and not directly on the medication tubes.   If your medication is too expensive, please contact our office at [phone] option 4 or send us  a message through MyChart.   We are unable to tell what your co-pay for medications will be in advance as this is different depending on your insurance coverage. However, we may be able to find a substitute medication at lower cost or fill out paperwork to get insurance to cover a needed medication.   If a prior authorization is required to get your medication covered by your insurance company, please allow us  1-2 business days to complete this process.  Drug prices often vary depending on where the prescription is filled and some pharmacies may offer cheaper prices.  The website www.goodrx.com contains coupons for medications through different pharmacies. The prices here do not account for what the cost may be with help from insurance (it may be cheaper with your insurance), but the website can give  you the price if you did not use any insurance.  - You can print the associated coupon and take it with your prescription to the pharmacy.  - You may also stop by our office during regular business hours and pick up a GoodRx coupon card.  - If you need your prescription sent electronically to a different pharmacy, notify our office through [facility] or by phone at [phone] option 4.     Si Usted Necesita Algo Despus de Su Visita  Tambin puede enviarnos un mensaje a travs de  Clinical Cytogeneticist. Por lo general respondemos a los mensajes de MyChart en el transcurso de 1 a 2 das hbiles.  Para renovar recetas, por favor pida a su farmacia que se ponga en contacto con nuestra oficina. Randi lakes de fax es [place] [phone].  Si tiene un asunto urgente cuando la clnica est cerrada y que no puede esperar hasta el siguiente da hbil, puede llamar/localizar a su doctor(a) al nmero que aparece a continuacin.   Por favor, tenga en cuenta que aunque hacemos todo lo posible para estar disponibles para asuntos urgentes fuera del horario de [place], no estamos disponibles las 24 horas del da, los 7 [address]  [address] de la [place].   Si tiene un problema urgente y no puede comunicarse con nosotros, puede optar por buscar atencin mdica  en el consultorio de su doctor(a), en una clnica privada, en un centro de atencin urgente o en una sala de emergencias.  Si tiene engineer, drilling, por favor llame inmediatamente al 911 o vaya a la sala de emergencias.  Nmeros de bper  - Dr. Hester: [phone]  - Dra. Jackquline: [phone]  - Dr. Claudene: [phone]   En caso de inclemencias del tiempo, por favor llame a landry capes principal al [phone] para una actualizacin sobre el [place] de cualquier retraso o cierre.  Consejos para la medicacin en dermatologa: Por favor, guarde las cajas en las que vienen los medicamentos de uso tpico para ayudarle a seguir las instrucciones sobre dnde y cmo usarlos. Las farmacias generalmente imprimen las instrucciones del medicamento slo en las cajas y no directamente en los tubos del [place].   Si su medicamento es muy caro, por favor, pngase en contacto con landry rieger llamando al [phone] y presione la opcin 4 o envenos un mensaje a travs de Clinical Cytogeneticist.   No podemos decirle cul ser su copago por los medicamentos por adelantado ya que esto es diferente dependiendo de la cobertura de su seguro. Sin embargo, es posible que  podamos encontrar un medicamento sustituto a audiological scientist un formulario para que el seguro cubra el medicamento que se considera necesario.   Si se requiere una autorizacin previa para que su compaa de seguros cubra su medicamento, por favor permtanos de 1 a 2 das hbiles para completar este proceso.  Los precios de los medicamentos varan con frecuencia dependiendo del environmental consultant de dnde se surte la receta y alguna farmacias pueden ofrecer precios ms baratos.  El sitio web www.goodrx.com tiene cupones para medicamentos de health and safety inspector. Los precios aqu no tienen en cuenta lo que podra costar con la ayuda del seguro (puede ser ms barato con su seguro), pero el sitio web puede darle el precio si no utiliz tourist information centre manager.  - Puede imprimir el cupn correspondiente y llevarlo con su receta a la farmacia.  - Tambin puede pasar por nuestra oficina durante el horario de atencin regular y education officer, museum una tarjeta de cupones de GoodRx.  -  Si necesita que su receta se enve electrnicamente a psychiatrist, informe a nuestra oficina a travs de MyChart de [place] o por telfono llamando al [phone] y presione la opcin 4.

## 2024-09-17 NOTE — Progress Notes (Signed)
    Subjective   Gabrielle Delacruz is a 28 y.o. female who presents for the following: Lesion(s) of concern . Patient is new patient.  Today patient reports: LOC at left posterior shoulder, has had it about 6 years, only painful if messes with it, has not increased in size. Hx melanoma (mother) and her provider wanted it checked out.  Review of Systems:    No other skin or systemic complaints except as noted in HPI or Assessment and Plan.  The following portions of the chart were reviewed this encounter and updated as appropriate: medications, allergies, medical history  Relevant Medical History:  Family history of skin cancer - Melanoma (mother)   Objective  (SKPE) Well appearing patient in no apparent distress; mood and affect are within normal limits. Examination was performed of the: Focused Exam of: Left shoulder, chest, bilateral axilla   Examination notable for: - Dermatofibroma(s): Firm dermal nodule(s) with a hyperpigmented halo and a positive dimple sign located on the left posterior shoulder. -Nevi - well demarcated brown macules   - Firm, erythematous papulonodules and cysts in the axilla and groin  Examination limited by: Undergarments, Shoes or socks , Clothing, and Patient deferred removal       Assessment & Plan  (SKAP)   Benign Lesions/ Findings:  - Dermatofibroma - Nevi - Reassurance provided regarding the benign appearance of lesions noted on exam today; no treatment is indicated in the absence of symptoms/changes. - Reinforced importance of photoprotective strategies including liberal and frequent sunscreen use of a broad-spectrum SPF 30 or greater, use of protective clothing, and sun avoidance for prevention of cutaneous malignancy and photoaging.  Counseled patient on the importance of regular self-skin monitoring as well as routine clinical skin examinations as scheduled.   Hidradenitis suppurativa, mild Chronic and persistent condition with duration or expected  duration over one year. Condition is symptomatic and bothersome to patient. Patient is flaring and not currently at treatment goal.  - Discussed the chronic, relapsing nature of this skin disease, characterized by recurring inflamed painful nodules with abscess and sinus formation, and scarring - Explained to patient that early, isolated lesions can remit with medical treatment, but once sinus tracts are established treatment options are limited and surgery is the only definitive treatment; however, recurrence rate can be up to 25% even with surgery - Start topical clindamycin swabs 1% once daily to active areas  - - start benzoyl peroxide 5% wash in the morning. Educated patient about proper use and potential side effects, including dryness, irritation, and bleaching of fabrics.  Personal history of family history of melanoma - Reviewed medical history for full details  - Reviewed sun protective measures as above - Encouraged full body skin exams     Level of service outlined above   Patient instructions (SKPI)   Procedures, orders, diagnosis for this visit:    There are no diagnoses linked to this encounter.  Return to clinic: No follow-ups on file.  I, Jacquelynn V. Wilfred, CMA, am acting as scribe for Lauraine JAYSON Kanaris, MD.  Documentation: I have reviewed the above documentation for accuracy and completeness, and I agree with the above.  Lauraine JAYSON Kanaris, MD

## 2024-10-02 ENCOUNTER — Ambulatory Visit: Payer: Self-pay

## 2024-11-06 ENCOUNTER — Ambulatory Visit: Payer: Self-pay
